# Patient Record
Sex: Female | Born: 1980 | Race: Black or African American | Hispanic: No | Marital: Single | Smoking: Current every day smoker
Health system: Southern US, Community
[De-identification: ages and names within clinical notes are randomized; demographics above are authoritative.]

## PROBLEM LIST (undated history)

## (undated) DIAGNOSIS — D649 Anemia, unspecified: Secondary | ICD-10-CM

## (undated) HISTORY — PX: DILATION AND CURETTAGE OF UTERUS: SHX78

## (undated) HISTORY — PX: WRIST GANGLION EXCISION: SUR520

---

## 2011-01-08 ENCOUNTER — Encounter: Payer: Self-pay | Admitting: Emergency Medicine

## 2011-01-08 ENCOUNTER — Emergency Department (HOSPITAL_COMMUNITY)
Admission: EM | Admit: 2011-01-08 | Discharge: 2011-01-08 | Disposition: A | Discharge status: Home / Self Care | Payer: Medicaid Other | Attending: Emergency Medicine | Admitting: Emergency Medicine

## 2011-01-08 DIAGNOSIS — R109 Unspecified abdominal pain: Secondary | ICD-10-CM | POA: Insufficient documentation

## 2011-01-08 DIAGNOSIS — M545 Low back pain, unspecified: Secondary | ICD-10-CM | POA: Insufficient documentation

## 2011-01-08 DIAGNOSIS — Z3201 Encounter for pregnancy test, result positive: Secondary | ICD-10-CM | POA: Insufficient documentation

## 2011-01-08 DIAGNOSIS — F17200 Nicotine dependence, unspecified, uncomplicated: Secondary | ICD-10-CM | POA: Insufficient documentation

## 2011-01-08 LAB — WET PREP, GENITAL
Clue Cells Wet Prep HPF POC: NONE SEEN
Trich, Wet Prep: NONE SEEN

## 2011-01-08 LAB — CBC
HCT: 34.3 % — ABNORMAL LOW (ref 36.0–46.0)
Hemoglobin: 11.3 g/dL — ABNORMAL LOW (ref 12.0–15.0)
RBC: 3.93 MIL/uL (ref 3.87–5.11)
WBC: 10.2 10*3/uL (ref 4.0–10.5)

## 2011-01-08 LAB — URINALYSIS, ROUTINE W REFLEX MICROSCOPIC
Glucose, UA: NEGATIVE mg/dL
Hgb urine dipstick: NEGATIVE
Specific Gravity, Urine: 1.044 — ABNORMAL HIGH (ref 1.005–1.030)
pH: 6 (ref 5.0–8.0)

## 2011-01-08 LAB — POCT PREGNANCY, URINE: Preg Test, Ur: POSITIVE

## 2011-01-08 LAB — URINE MICROSCOPIC-ADD ON

## 2011-01-08 MED ORDER — ACETAMINOPHEN 325 MG PO TABS
650.0000 mg | ORAL_TABLET | Freq: Once | ORAL | Status: AC
  Administered 2011-01-08: 650 mg via ORAL
  Filled 2011-01-08: qty 2

## 2011-01-08 NOTE — ED Notes (Signed)
C/o lower back pain and abd pain since last night.  Reports positive home pregnancy test on Tuesday.  States she has been taking test and they have been negative.  States she has been bleeding some but not usual amount for the past several months and she noticed that abd was getting bigger 2 months ago.  States it feels like she is having contractions since last night.

## 2011-01-08 NOTE — ED Provider Notes (Signed)
Medical screening examination/treatment/procedure(s) were conducted as a shared visit with non-physician practitioner(s) and myself.  I personally evaluated the patient during the encounter.  Last menstrual period March 2012.  Abdomen is gravid. Ultrasound ordered to assess pregnancy.  Donnetta Hutching, MD 01/08/11 [phone]

## 2011-01-08 NOTE — ED Notes (Signed)
Patient going home.

## 2011-01-08 NOTE — ED Notes (Signed)
States last full period was in March 2012.  States since then she only bleeds for 1 or 2 days.

## 2011-01-08 NOTE — ED Provider Notes (Signed)
History    this is a G6 P3 patient who presents to the ED with a chief complaint of low back pain and abdominal pain since last night. Patient states for the past week she's felt as if something is moving in her abdomen and for the past 2 days she noticed contractions. Patient states she recalled that her last normal menstrual period was sometime in March. However, she has been having intermittent episodes of what appears to be menstruation each month, except that it only lasting for a few days. She does not think that she was pregnant because she has tried checking pregnancy test several times in the past with negative results. A week ago, she has a positive pregnancy test.  CSN: [id number] Arrival date & time: 01/08/2011  7:06 PM   First MD Initiated Contact with Patient 01/08/11 1945      Chief Complaint  Patient presents with  . Abdominal Cramping    (Consider location/radiation/quality/duration/timing/severity/associated sxs/prior treatment) HPI  History reviewed. No pertinent past medical history.  History reviewed. No pertinent past surgical history.  No family history on file.  History  Substance Use Topics  . Smoking status: Current Everyday Smoker  . Smokeless tobacco: Not on file  . Alcohol Use: No    OB History    Grav Para Term Preterm Abortions TAB SAB Ect Mult Living   5 1 1       3       Review of Systems  All other systems reviewed and are negative.    Allergies  Hydrocodone  Home Medications  No current outpatient prescriptions on file.  BP 135/78  Pulse 79  Temp(Src) 98.2 F (36.8 C) (Oral)  Resp 22  SpO2 96%  LMP 04/24/2010  Physical Exam  Nursing note and vitals reviewed. Constitutional: She appears well-developed and well-nourished. No distress.  HENT:  Head: Normocephalic and atraumatic.  Eyes: Conjunctivae are normal.  Neck: Normal range of motion. Neck supple.  Cardiovascular: Normal rate and regular rhythm.   Pulmonary/Chest:  Effort normal and breath sounds normal. She exhibits no tenderness.  Abdominal: Soft. There is tenderness in the suprapubic area. There is no rebound, no tenderness at McBurney's point and negative Murphy's sign. Hernia confirmed negative in the right inguinal area and confirmed negative in the left inguinal area.    Genitourinary: Vagina normal. Pelvic exam was performed with patient supine. There is no rash or lesion on the right labia. There is no rash or lesion on the left labia. Uterus is enlarged. Uterus is not tender. Cervix exhibits discharge. Cervix exhibits no motion tenderness. Right adnexum displays no mass and no tenderness. Left adnexum displays no mass and no tenderness. No erythema, tenderness or bleeding around the vagina. No vaginal discharge found.       No evidence of uterus dilation, effacement, or bloody discharge.  Cervical os close.    Lymphadenopathy:       Right: No inguinal adenopathy present.       Left: No inguinal adenopathy present.    ED Course  Procedures (including critical care time)   Labs Reviewed  POCT PREGNANCY, URINE  URINALYSIS, ROUTINE W REFLEX MICROSCOPIC   No results found.   No diagnosis found.    MDM  Patient is pregnant, and appears to be greater than [redacted] weeks pregnant a pelvic examination is unremarkable. Cervical os close, no evidence of effacement or dilation.   no evidence concerning for pelvic inflammatory disease.   Abdominal ultrasound ordered for further evaluation  for patient's pregnancy.  However, patient refused ultrasound stating that she has to go home to take care of her kids. I explained risks and benefits of ultrasound, and also discussed risk of ectopic pregnancy. Patient acknowledge the risk, and [facility]. She denies significant abdominal pain at this time.     Fayrene Helper, [place] 01/08/11 2237

## 2011-01-08 NOTE — ED Notes (Signed)
Patient states she has been having lower abd and back pain for the last several days.  + mucous discharge.  Took 6 home preg test and the last one came up positive.  Abd enlarged.  Patient tearful and also c/o pain to the right ear and side of face.   3 children at the bedside  Stated the youngest was premature at 36 weeks and the next one was premature at 37 weeks.

## 2011-01-09 LAB — GC/CHLAMYDIA PROBE AMP, GENITAL
Chlamydia, DNA Probe: NEGATIVE
GC Probe Amp, Genital: NEGATIVE

## 2011-01-16 ENCOUNTER — Inpatient Hospital Stay (HOSPITAL_COMMUNITY)
Admission: AD | Admit: 2011-01-16 | Discharge: 2011-01-16 | Disposition: A | Discharge status: Home / Self Care | Payer: Medicaid Other | Source: Ambulatory Visit | Attending: Obstetrics & Gynecology | Admitting: Obstetrics & Gynecology

## 2011-01-16 ENCOUNTER — Inpatient Hospital Stay (HOSPITAL_COMMUNITY): Payer: Medicaid Other

## 2011-01-16 ENCOUNTER — Encounter (HOSPITAL_COMMUNITY): Payer: Self-pay | Admitting: *Deleted

## 2011-01-16 DIAGNOSIS — Z3480 Encounter for supervision of other normal pregnancy, unspecified trimester: Secondary | ICD-10-CM

## 2011-01-16 DIAGNOSIS — O479 False labor, unspecified: Secondary | ICD-10-CM | POA: Insufficient documentation

## 2011-01-16 LAB — DIFFERENTIAL
Basophils Absolute: 0 10*3/uL (ref 0.0–0.1)
Basophils Relative: 0 % (ref 0–1)
Eosinophils Absolute: 0.1 10*3/uL (ref 0.0–0.7)
Eosinophils Relative: 1 % (ref 0–5)
Lymphocytes Relative: 19 % (ref 12–46)
Lymphs Abs: 1.8 10*3/uL (ref 0.7–4.0)
Monocytes Absolute: 0.8 10*3/uL (ref 0.1–1.0)
Monocytes Relative: 8 % (ref 3–12)
Neutro Abs: 6.8 10*3/uL (ref 1.7–7.7)
Neutrophils Relative %: 72 % (ref 43–77)

## 2011-01-16 LAB — CBC
HCT: 33.9 % — ABNORMAL LOW (ref 36.0–46.0)
Hemoglobin: 10.9 g/dL — ABNORMAL LOW (ref 12.0–15.0)
MCH: 29 pg (ref 26.0–34.0)
MCHC: 32.2 g/dL (ref 30.0–36.0)
MCV: 90.2 fL (ref 78.0–100.0)
Platelets: 191 10*3/uL (ref 150–400)
RBC: 3.76 MIL/uL — ABNORMAL LOW (ref 3.87–5.11)
RDW: 16.2 % — ABNORMAL HIGH (ref 11.5–15.5)
WBC: 9.5 10*3/uL (ref 4.0–10.5)

## 2011-01-16 LAB — HIV ANTIBODY (ROUTINE TESTING W REFLEX): HIV: NONREACTIVE

## 2011-01-16 LAB — RAPID HIV SCREEN (WH-MAU): Rapid HIV Screen: NONREACTIVE

## 2011-01-16 LAB — HEPATITIS B SURFACE ANTIGEN: Hepatitis B Surface Ag: NEGATIVE

## 2011-01-16 LAB — RPR: RPR Ser Ql: NONREACTIVE

## 2011-01-16 MED ORDER — HYDROXYZINE HCL 25 MG PO TABS: 25.0000 mg | ORAL_TABLET | Freq: Four times a day (QID) | ORAL | Status: DC

## 2011-01-16 NOTE — Progress Notes (Signed)
Pt states, " About a week ago, I went to [facility] ED, and they had a positive pregnancy test. I haven't missed any periods. Tonight I started having contractions at 8pm. I looked on web MD, and I think I'm full term according to that calculation. If I am laying down and try to turn over I have pressure and I have a sharp pain in my low abdomen."

## 2011-01-16 NOTE — Progress Notes (Signed)
"  I just found out I was pregnant last week.  I went to [facility] for a toothache and headache 01/08/11.  I just had a (+) UPT that Tuesday before.  I had some idea that I was pregnant, but I always had (-) UPTs and never missed my period, so I didn't think anything about it.  My last 4-5 day period was in March, then they changed to 1-2 days, but never stopped.  That's how I knew I was pregnant in the past was a missed period.  For the 2-3 days has been decreased FM as compared to the last 2 weeks."

## 2011-01-16 NOTE — ED Provider Notes (Cosign Needed)
Stacey Blake is a 30 y.o. female presenting with unknown gestation with concern for labor.  Stacey Blake states that she did not know she was pregnant until last week when she presented to the ER for tooth pain.  There a pregnancy test was performed and found to be positive.  Prior to this she states that she was getting regular periods and had taken a few pregnancy tests which were negative.  She starting having contractions last evening around 8pm which have increased in intensity and are now occuring every 2 minutes.  She also has concern that she has broken her water.  She has not felt a large gush of fluid but does state that every time she uses the bathroom she notes continued leakage of fluid.  History OB History    Grav Para Term Preterm Abortions TAB SAB Ect Mult Living   6 4 1 3 1  1  1 3      History reviewed. No pertinent past medical history. Past Surgical History  Procedure Date  . Wrist ganglion excision 2007 x2   Family History: family history is not on file. Social History:  reports that she has been smoking Cigarettes.  She has a 3 pack-year smoking history. She does not have any smokeless tobacco history on file. She reports that she drinks alcohol. She reports that she uses illicit drugs (Marijuana and Cocaine).  ROS As per HPI  Dilation: 2 Effacement (%): 80 Station: -3 Exam by:: Dr. Remigio Blake & Stacey Miss, RN Blood pressure 127/66, pulse 70, temperature 98.6 F (37 C), temperature source Oral, resp. rate 20, height 5' 9.25" (1.759 m), weight 81.421 kg (179 lb 8 oz), last menstrual period 04/24/2010, SpO2 98.00%, unknown if currently breastfeeding. Exam Physical Exam  Constitutional: She is oriented to person, place, and time. She appears well-developed and well-nourished.  HENT:  Head: Normocephalic and atraumatic.  Cardiovascular: Normal rate, regular rhythm and normal heart sounds.   Respiratory: Effort normal and breath sounds normal. She has no wheezes. She has no  rales.  GI:       Normal gravid fundus  Genitourinary: Vagina normal.  Neurological: She is alert and oriented to person, place, and time.  Skin: Skin is warm and dry.  Psychiatric: She has a normal mood and affect. Her behavior is normal. Judgment and thought content normal.    Prenatal labs: ABO, Rh: --/--/O POS (11/16 2025) Antibody:   Rubella:   RPR:    HBsAg:    HIV:    GBS:    Dilation: 2 Effacement (%): 80 Cervical Position: Posterior Station: -3 Presentation: Vertex Exam by:: dr Stacey Blake  Assessment/Plan: A: 30 y.o. presenting at unknown gestation for rule out labor  P: 1. IUP: Prenatal labs and an ultrasound was performed.  Ultrasound shows a female fetus between 35-38weeks.  Patient's fundus was measured to be 36cm so size appears to be consistent with dates.  Patient was discharged in stable condition and advised to seek prenatal care.  Labor precautions given.  2. Rule out labor: Patient was observed for 2 hours and did not demonstrate any cervical change.  A prescription for vistaril was given and patient was discharged to home.  Stacey Blake 01/16/2011, 2:44 AM

## 2011-01-22 ENCOUNTER — Encounter (HOSPITAL_COMMUNITY): Payer: Self-pay

## 2011-01-22 ENCOUNTER — Inpatient Hospital Stay (HOSPITAL_COMMUNITY)
Admission: AD | Admit: 2011-01-22 | Discharge: 2011-01-22 | Disposition: A | Discharge status: Home / Self Care | Payer: Self-pay | Source: Ambulatory Visit | Attending: Family Medicine | Admitting: Family Medicine

## 2011-01-22 DIAGNOSIS — K047 Periapical abscess without sinus: Secondary | ICD-10-CM | POA: Insufficient documentation

## 2011-01-22 DIAGNOSIS — O99891 Other specified diseases and conditions complicating pregnancy: Secondary | ICD-10-CM | POA: Insufficient documentation

## 2011-01-22 LAB — URINALYSIS, ROUTINE W REFLEX MICROSCOPIC
Hgb urine dipstick: NEGATIVE
Ketones, ur: NEGATIVE mg/dL
Nitrite: NEGATIVE
Protein, ur: 30 mg/dL — AB
Specific Gravity, Urine: 1.03 (ref 1.005–1.030)
Urobilinogen, UA: 0.2 mg/dL (ref 0.0–1.0)

## 2011-01-22 LAB — URINE MICROSCOPIC-ADD ON

## 2011-01-22 MED ORDER — CEPHALEXIN 500 MG PO CAPS: 500.0000 mg | ORAL_CAPSULE | Freq: Four times a day (QID) | ORAL | Status: AC

## 2011-01-22 MED ORDER — ACETAMINOPHEN-CODEINE #3 300-30 MG PO TABS: 1.0000 | ORAL_TABLET | ORAL | Status: AC | PRN

## 2011-01-22 MED ORDER — CEPHALEXIN 500 MG PO CAPS
500.0000 mg | ORAL_CAPSULE | Freq: Once | ORAL | Status: AC
  Administered 2011-01-22: 500 mg via ORAL
  Filled 2011-01-22: qty 1

## 2011-01-22 MED ORDER — ACETAMINOPHEN-CODEINE #3 300-30 MG PO TABS
1.0000 | ORAL_TABLET | Freq: Once | ORAL | Status: AC
  Administered 2011-01-22: 1 via ORAL
  Filled 2011-01-22: qty 1

## 2011-01-22 NOTE — Progress Notes (Signed)
Pt states r ear pain that began early this afternoon, r ear radiating pain down into her jaw. Took 2 extra strength tylenol at 1300 w/no relief. Clear vaginal d/c noted-not watery, denies bleeding, +FM.

## 2011-01-22 NOTE — ED Provider Notes (Signed)
I have seen/examined this pt and agree with the student's assessment and plan.   Taequan Stockhausen E.

## 2011-01-22 NOTE — ED Provider Notes (Signed)
History    Stacey Blake is a 30 YO [id number] at [redacted]w[redacted]d presents today with severe jaw/tooth pain.  Chief Complaint  Patient presents with   Otalgia   HPI Patient reports she began feeling toothache at 11:30 a.m. At the molar right side this morning and took Tylenol to help relieve the pain. This was not helpful and pain became worse and spread to just below her ear and jaw. Patient tried cold compress and warm compress without changes to symptoms. Patient has had toothache before and a tooth removed in the area of pain. Patient reports pain is worse with jaw movement, chewing, talking. Patient denies fevers, chills, recent illness, or sick contacts.  OB History    Grav Para Term Preterm Abortions TAB SAB Ect Mult Living   6 4 1 3 1  1  1 3       Past Medical History  Diagnosis Date   Preterm labor     Past Surgical History  Procedure Date   Wrist ganglion excision 2007 x2   Dilation and curettage of uterus     History reviewed. No pertinent family history.  History  Substance Use Topics   Smoking status: Current Everyday Smoker -- 0.2 packs/day for 12 years    Types: Cigarettes   Smokeless tobacco: Never Used   Alcohol Use: No     Occassional (last drink: 2.5 months ago-Tequila)    Allergies:  Allergies  Allergen Reactions   Hydrocodone Rash    Prescriptions prior to admission  Medication Sig Dispense Refill   acetaminophen (TYLENOL) 500 MG tablet Take 1,000 mg by mouth every 6 (six) hours as needed. Tooth ache         Review of Systems  Constitutional: Negative for fever and chills.  HENT: Positive for ear pain. Negative for hearing loss, tinnitus and ear discharge.        Right ear pain. Right sided face pain and right molar toothache.  Genitourinary: Negative.   Musculoskeletal: Negative for myalgias.  Neurological: Negative for dizziness and headaches.   Physical Exam   Blood pressure 102/70, pulse 69, temperature 98 F (36.7 C), temperature source  Oral, resp. rate 16, height 5' 10.5" (1.791 m), last menstrual period 04/24/2010, unknown if currently breastfeeding.  Physical Exam  Constitutional: She is oriented to person, place, and time. She appears well-developed and well-nourished.  HENT:  Head: Normocephalic and atraumatic.  Right Ear: External ear normal.  Left Ear: External ear normal.  Nose: Nose normal.  Mouth/Throat: Oropharynx is clear and moist.       Right ear extremely tender to touch. Right tympanic membrane diminished light reflection, possible fluid behind membrane. Erythematous right ear canal.  Right jawline extremely tender to light touch. Not warm or erythematous.   Oral mucosa moist. Teeth missing, poor dentition. Right third molar present, appears unhealthy. Right second molar removed. Premolars not present. Gums pink, moist, without lesions. No exudate observed.   Eyes: Conjunctivae are normal. Pupils are equal, round, and reactive to light.  Neck: Normal range of motion. Neck supple.  Cardiovascular: Normal rate, regular rhythm and normal heart sounds.   Respiratory: Effort normal and breath sounds normal.  Neurological: She is alert and oriented to person, place, and time.  Skin: Skin is warm.    MAU Course  Procedures Physical exam  Assessment and Plan  1. Dental abscess: Discussed with patient probably cause of pain is due to dental abscess. Patient will receive antibiotics, cephALEXin (KEFLEX) capsule 500 mg and narcotic  pain medication to treat. Encouraged patient to seek dental care soon for follow up. Also encouraged patient to follow through with outpatient prenatal care here at [facility]. Patient will be discharged home.   Mosetta Putt, PA-S 01/22/2011, 4:52 PM

## 2011-01-23 ENCOUNTER — Inpatient Hospital Stay (HOSPITAL_COMMUNITY)
Admission: AD | Admit: 2011-01-23 | Discharge: 2011-01-26 | DRG: 766 | Disposition: A | Discharge status: Home / Self Care | Payer: Medicaid Other | Source: Ambulatory Visit | Attending: Family Medicine | Admitting: Family Medicine

## 2011-01-23 ENCOUNTER — Encounter (HOSPITAL_COMMUNITY): Payer: Self-pay

## 2011-01-23 ENCOUNTER — Encounter (HOSPITAL_COMMUNITY): Payer: Self-pay | Admitting: Anesthesiology

## 2011-01-23 ENCOUNTER — Encounter (HOSPITAL_COMMUNITY)
Admission: AD | Disposition: A | Discharge status: Home / Self Care | Payer: Self-pay | Source: Ambulatory Visit | Attending: Family Medicine

## 2011-01-23 ENCOUNTER — Inpatient Hospital Stay (HOSPITAL_COMMUNITY): Payer: Medicaid Other | Admitting: Anesthesiology

## 2011-01-23 DIAGNOSIS — O4290 Premature rupture of membranes, unspecified as to length of time between rupture and onset of labor, unspecified weeks of gestation: Secondary | ICD-10-CM | POA: Diagnosis present

## 2011-01-23 DIAGNOSIS — K0889 Other specified disorders of teeth and supporting structures: Secondary | ICD-10-CM | POA: Diagnosis present

## 2011-01-23 DIAGNOSIS — D649 Anemia, unspecified: Secondary | ICD-10-CM | POA: Diagnosis not present

## 2011-01-23 DIAGNOSIS — O9903 Anemia complicating the puerperium: Secondary | ICD-10-CM

## 2011-01-23 DIAGNOSIS — O0930 Supervision of pregnancy with insufficient antenatal care, unspecified trimester: Secondary | ICD-10-CM

## 2011-01-23 DIAGNOSIS — Z3009 Encounter for other general counseling and advice on contraception: Secondary | ICD-10-CM | POA: Diagnosis present

## 2011-01-23 DIAGNOSIS — O0943 Supervision of pregnancy with grand multiparity, third trimester: Secondary | ICD-10-CM

## 2011-01-23 DIAGNOSIS — F17200 Nicotine dependence, unspecified, uncomplicated: Secondary | ICD-10-CM | POA: Diagnosis present

## 2011-01-23 LAB — CBC
HCT: 34.9 % — ABNORMAL LOW (ref 36.0–46.0)
Hemoglobin: 11.2 g/dL — ABNORMAL LOW (ref 12.0–15.0)
MCHC: 32.1 g/dL (ref 30.0–36.0)
MCV: 89.5 fL (ref 78.0–100.0)
RDW: 15.8 % — ABNORMAL HIGH (ref 11.5–15.5)

## 2011-01-23 LAB — RAPID URINE DRUG SCREEN, HOSP PERFORMED
Amphetamines: NOT DETECTED
Benzodiazepines: NOT DETECTED
Opiates: POSITIVE — AB

## 2011-01-23 LAB — AMNISURE RUPTURE OF MEMBRANE (ROM) NOT AT ARMC: Amnisure ROM: POSITIVE

## 2011-01-23 SURGERY — Surgical Case
Anesthesia: Spinal | Site: Abdomen | Wound class: Clean Contaminated

## 2011-01-23 MED ORDER — FENTANYL CITRATE 0.05 MG/ML IJ SOLN
INTRAMUSCULAR | Status: AC
  Filled 2011-01-23: qty 2

## 2011-01-23 MED ORDER — CEFAZOLIN SODIUM 1-5 GM-% IV SOLN
INTRAVENOUS | Status: DC | PRN
  Administered 2011-01-23: 1 g via INTRAVENOUS

## 2011-01-23 MED ORDER — LACTATED RINGERS IV SOLN
INTRAVENOUS | Status: DC
  Administered 2011-01-24 (×2): via INTRAVENOUS

## 2011-01-23 MED ORDER — FENTANYL CITRATE 0.05 MG/ML IJ SOLN
INTRAMUSCULAR | Status: DC | PRN
  Administered 2011-01-23: 15 ug via INTRATHECAL

## 2011-01-23 MED ORDER — PHENYLEPHRINE HCL 10 MG/ML IJ SOLN
INTRAMUSCULAR | Status: DC | PRN
  Administered 2011-01-23: 80 ug via INTRAVENOUS
  Administered 2011-01-23: 120 ug via INTRAVENOUS
  Administered 2011-01-23: 80 ug via INTRAVENOUS
  Administered 2011-01-23: 120 ug via INTRAVENOUS

## 2011-01-23 MED ORDER — SODIUM CHLORIDE 0.9 % IR SOLN
Status: DC | PRN
  Administered 2011-01-23: 1000 mL

## 2011-01-23 MED ORDER — KETOROLAC TROMETHAMINE 60 MG/2ML IM SOLN
60.0000 mg | Freq: Once | INTRAMUSCULAR | Status: AC | PRN
  Administered 2011-01-23: 60 mg via INTRAMUSCULAR

## 2011-01-23 MED ORDER — FENTANYL 2.5 MCG/ML BUPIVACAINE 1/10 % EPIDURAL INFUSION (WH - ANES)
14.0000 mL/h | INTRAMUSCULAR | Status: DC
  Filled 2011-01-23: qty 60

## 2011-01-23 MED ORDER — IBUPROFEN 600 MG PO TABS
600.0000 mg | ORAL_TABLET | Freq: Four times a day (QID) | ORAL | Status: DC | PRN
  Filled 2011-01-23 (×3): qty 1

## 2011-01-23 MED ORDER — LANOLIN HYDROUS EX OINT: 1.0000 "application " | TOPICAL_OINTMENT | CUTANEOUS | Status: DC | PRN

## 2011-01-23 MED ORDER — BUPIVACAINE IN DEXTROSE 0.75-8.25 % IT SOLN
INTRATHECAL | Status: DC | PRN
  Administered 2011-01-23: 1.5 mL via INTRATHECAL

## 2011-01-23 MED ORDER — PENICILLIN G POTASSIUM 5000000 UNITS IJ SOLR
2.5000 10*6.[IU] | INTRAVENOUS | Status: DC
  Administered 2011-01-23: 2.5 10*6.[IU] via INTRAVENOUS
  Filled 2011-01-23 (×7): qty 2.5

## 2011-01-23 MED ORDER — PHENYLEPHRINE 40 MCG/ML (10ML) SYRINGE FOR IV PUSH (FOR BLOOD PRESSURE SUPPORT)
80.0000 ug | PREFILLED_SYRINGE | INTRAVENOUS | Status: DC | PRN
  Filled 2011-01-23: qty 5

## 2011-01-23 MED ORDER — PRENATAL PLUS 27-1 MG PO TABS
1.0000 | ORAL_TABLET | Freq: Every day | ORAL | Status: DC
  Administered 2011-01-24 – 2011-01-26 (×2): 1 via ORAL
  Filled 2011-01-23 (×2): qty 1

## 2011-01-23 MED ORDER — SCOPOLAMINE 1 MG/3DAYS TD PT72
1.0000 | MEDICATED_PATCH | Freq: Once | TRANSDERMAL | Status: DC
  Filled 2011-01-23: qty 1

## 2011-01-23 MED ORDER — NALBUPHINE HCL 10 MG/ML IJ SOLN
5.0000 mg | INTRAMUSCULAR | Status: DC | PRN
  Administered 2011-01-23: 5 mg via INTRAVENOUS
  Administered 2011-01-24: 10 mg via INTRAVENOUS
  Filled 2011-01-23 (×2): qty 1

## 2011-01-23 MED ORDER — FLEET ENEMA 7-19 GM/118ML RE ENEM: 1.0000 | ENEMA | RECTAL | Status: DC | PRN

## 2011-01-23 MED ORDER — ONDANSETRON HCL 4 MG/2ML IJ SOLN: 4.0000 mg | Freq: Three times a day (TID) | INTRAMUSCULAR | Status: DC | PRN

## 2011-01-23 MED ORDER — DIPHENHYDRAMINE HCL 25 MG PO CAPS: 25.0000 mg | ORAL_CAPSULE | Freq: Four times a day (QID) | ORAL | Status: DC | PRN

## 2011-01-23 MED ORDER — MIDAZOLAM HCL 2 MG/2ML IJ SOLN
INTRAMUSCULAR | Status: AC
  Filled 2011-01-23: qty 2

## 2011-01-23 MED ORDER — OXYTOCIN 10 UNIT/ML IJ SOLN
20.0000 [IU] | INTRAVENOUS | Status: DC | PRN
  Administered 2011-01-23: 20 [IU] via INTRAVENOUS

## 2011-01-23 MED ORDER — TETANUS-DIPHTH-ACELL PERTUSSIS 5-2.5-18.5 LF-MCG/0.5 IM SUSP
0.5000 mL | Freq: Once | INTRAMUSCULAR | Status: AC
  Administered 2011-01-26: 0.5 mL via INTRAMUSCULAR
  Filled 2011-01-23: qty 0.5

## 2011-01-23 MED ORDER — NALOXONE HCL 0.4 MG/ML IJ SOLN: 0.4000 mg | INTRAMUSCULAR | Status: DC | PRN

## 2011-01-23 MED ORDER — LACTATED RINGERS IV SOLN
INTRAVENOUS | Status: DC | PRN
  Administered 2011-01-23 (×2): via INTRAVENOUS

## 2011-01-23 MED ORDER — OXYCODONE-ACETAMINOPHEN 5-325 MG PO TABS
1.0000 | ORAL_TABLET | ORAL | Status: DC | PRN
  Administered 2011-01-24 – 2011-01-25 (×4): 1 via ORAL
  Filled 2011-01-23 (×4): qty 1

## 2011-01-23 MED ORDER — DIPHENHYDRAMINE HCL 50 MG/ML IJ SOLN: 25.0000 mg | INTRAMUSCULAR | Status: DC | PRN

## 2011-01-23 MED ORDER — MORPHINE SULFATE 0.5 MG/ML IJ SOLN
INTRAMUSCULAR | Status: AC
  Filled 2011-01-23: qty 10

## 2011-01-23 MED ORDER — PENICILLIN G POTASSIUM 5000000 UNITS IJ SOLR
5.0000 10*6.[IU] | Freq: Once | INTRAVENOUS | Status: AC
  Administered 2011-01-23: 5 10*6.[IU] via INTRAVENOUS
  Filled 2011-01-23: qty 5

## 2011-01-23 MED ORDER — KETOROLAC TROMETHAMINE 60 MG/2ML IM SOLN
INTRAMUSCULAR | Status: AC
  Administered 2011-01-23: 60 mg via INTRAMUSCULAR
  Filled 2011-01-23: qty 2

## 2011-01-23 MED ORDER — DIPHENHYDRAMINE HCL 50 MG/ML IJ SOLN
12.5000 mg | INTRAMUSCULAR | Status: DC | PRN
  Administered 2011-01-24: 12.5 mg via INTRAVENOUS
  Filled 2011-01-23: qty 1

## 2011-01-23 MED ORDER — OXYTOCIN 20 UNITS IN LACTATED RINGERS INFUSION - SIMPLE
125.0000 mL/h | INTRAVENOUS | Status: AC
  Administered 2011-01-23: 125 mL/h via INTRAVENOUS

## 2011-01-23 MED ORDER — SODIUM CHLORIDE 0.9 % IJ SOLN: 3.0000 mL | INTRAMUSCULAR | Status: DC | PRN

## 2011-01-23 MED ORDER — LACTATED RINGERS IV SOLN: 500.0000 mL | Freq: Once | INTRAVENOUS | Status: DC

## 2011-01-23 MED ORDER — MIDAZOLAM HCL 5 MG/5ML IJ SOLN
INTRAMUSCULAR | Status: DC | PRN
  Administered 2011-01-23: 1 mg via INTRAVENOUS

## 2011-01-23 MED ORDER — KETOROLAC TROMETHAMINE 30 MG/ML IJ SOLN
30.0000 mg | Freq: Four times a day (QID) | INTRAMUSCULAR | Status: AC | PRN
  Administered 2011-01-24: 30 mg via INTRAVENOUS
  Filled 2011-01-23: qty 1

## 2011-01-23 MED ORDER — PHENYLEPHRINE 40 MCG/ML (10ML) SYRINGE FOR IV PUSH (FOR BLOOD PRESSURE SUPPORT): 80.0000 ug | PREFILLED_SYRINGE | INTRAVENOUS | Status: DC | PRN

## 2011-01-23 MED ORDER — SIMETHICONE 80 MG PO CHEW
80.0000 mg | CHEWABLE_TABLET | Freq: Three times a day (TID) | ORAL | Status: DC
  Administered 2011-01-24 – 2011-01-26 (×5): 80 mg via ORAL

## 2011-01-23 MED ORDER — INFLUENZA VIRUS VACC SPLIT PF IM SUSP
0.5000 mL | INTRAMUSCULAR | Status: AC
  Filled 2011-01-23: qty 0.5

## 2011-01-23 MED ORDER — NALBUPHINE SYRINGE 5 MG/0.5 ML
10.0000 mg | INJECTION | INTRAMUSCULAR | Status: DC | PRN
  Administered 2011-01-23: 10 mg via INTRAVENOUS
  Filled 2011-01-23 (×2): qty 1

## 2011-01-23 MED ORDER — METOCLOPRAMIDE HCL 5 MG/ML IJ SOLN: 10.0000 mg | Freq: Three times a day (TID) | INTRAMUSCULAR | Status: DC | PRN

## 2011-01-23 MED ORDER — IBUPROFEN 600 MG PO TABS
600.0000 mg | ORAL_TABLET | Freq: Four times a day (QID) | ORAL | Status: DC
  Administered 2011-01-24 – 2011-01-26 (×7): 600 mg via ORAL
  Filled 2011-01-23 (×4): qty 1

## 2011-01-23 MED ORDER — ONDANSETRON HCL 4 MG/2ML IJ SOLN: 4.0000 mg | INTRAMUSCULAR | Status: DC | PRN

## 2011-01-23 MED ORDER — KETOROLAC TROMETHAMINE 30 MG/ML IJ SOLN: 30.0000 mg | Freq: Four times a day (QID) | INTRAMUSCULAR | Status: AC | PRN

## 2011-01-23 MED ORDER — SENNOSIDES-DOCUSATE SODIUM 8.6-50 MG PO TABS
2.0000 | ORAL_TABLET | Freq: Every day | ORAL | Status: DC
  Administered 2011-01-24 – 2011-01-25 (×2): 2 via ORAL

## 2011-01-23 MED ORDER — EPHEDRINE 5 MG/ML INJ: 10.0000 mg | INTRAVENOUS | Status: DC | PRN

## 2011-01-23 MED ORDER — OXYCODONE-ACETAMINOPHEN 5-325 MG PO TABS: 2.0000 | ORAL_TABLET | ORAL | Status: DC | PRN

## 2011-01-23 MED ORDER — OXYTOCIN 20 UNITS IN LACTATED RINGERS INFUSION - SIMPLE
1.0000 m[IU]/min | INTRAVENOUS | Status: DC
  Administered 2011-01-23: 10 m[IU]/min via INTRAVENOUS
  Administered 2011-01-23: 9 m[IU]/min via INTRAVENOUS
  Administered 2011-01-23: 1 m[IU]/min via INTRAVENOUS
  Administered 2011-01-23: 6 m[IU]/min via INTRAVENOUS
  Filled 2011-01-23: qty 1000

## 2011-01-23 MED ORDER — DIPHENHYDRAMINE HCL 50 MG/ML IJ SOLN
INTRAMUSCULAR | Status: AC
  Filled 2011-01-23: qty 1

## 2011-01-23 MED ORDER — SIMETHICONE 80 MG PO CHEW
80.0000 mg | CHEWABLE_TABLET | ORAL | Status: DC | PRN
  Administered 2011-01-24: 80 mg via ORAL

## 2011-01-23 MED ORDER — SODIUM CHLORIDE 0.9 % IV SOLN
1.0000 ug/kg/h | INTRAVENOUS | Status: DC | PRN
  Filled 2011-01-23: qty 2.5

## 2011-01-23 MED ORDER — EPHEDRINE 5 MG/ML INJ
10.0000 mg | INTRAVENOUS | Status: DC | PRN
  Filled 2011-01-23: qty 4

## 2011-01-23 MED ORDER — DIPHENHYDRAMINE HCL 50 MG/ML IJ SOLN: 12.5000 mg | INTRAMUSCULAR | Status: DC | PRN

## 2011-01-23 MED ORDER — PHENYLEPHRINE 40 MCG/ML (10ML) SYRINGE FOR IV PUSH (FOR BLOOD PRESSURE SUPPORT)
PREFILLED_SYRINGE | INTRAVENOUS | Status: AC
  Filled 2011-01-23: qty 10

## 2011-01-23 MED ORDER — LACTATED RINGERS IV SOLN
INTRAVENOUS | Status: DC
  Administered 2011-01-23 (×2): via INTRAVENOUS

## 2011-01-23 MED ORDER — MEPERIDINE HCL 25 MG/ML IJ SOLN
INTRAMUSCULAR | Status: AC
  Filled 2011-01-23: qty 1

## 2011-01-23 MED ORDER — TERBUTALINE SULFATE 1 MG/ML IJ SOLN
0.2500 mg | Freq: Once | INTRAMUSCULAR | Status: AC | PRN
  Administered 2011-01-23: 0.25 mg via SUBCUTANEOUS
  Filled 2011-01-23: qty 1

## 2011-01-23 MED ORDER — MENTHOL 3 MG MT LOZG: 1.0000 | LOZENGE | OROMUCOSAL | Status: DC | PRN

## 2011-01-23 MED ORDER — NALBUPHINE HCL 10 MG/ML IJ SOLN: 10.0000 mg | INTRAMUSCULAR | Status: DC | PRN

## 2011-01-23 MED ORDER — NALBUPHINE HCL 10 MG/ML IJ SOLN
5.0000 mg | INTRAMUSCULAR | Status: DC | PRN
  Filled 2011-01-23: qty 1

## 2011-01-23 MED ORDER — ZOLPIDEM TARTRATE 5 MG PO TABS: 5.0000 mg | ORAL_TABLET | Freq: Every evening | ORAL | Status: DC | PRN

## 2011-01-23 MED ORDER — ACETAMINOPHEN 325 MG PO TABS: 650.0000 mg | ORAL_TABLET | ORAL | Status: DC | PRN

## 2011-01-23 MED ORDER — MEPERIDINE HCL 25 MG/ML IJ SOLN
INTRAMUSCULAR | Status: DC | PRN
  Administered 2011-01-23: 25 mg via INTRAVENOUS

## 2011-01-23 MED ORDER — DIBUCAINE 1 % RE OINT: 1.0000 "application " | TOPICAL_OINTMENT | RECTAL | Status: DC | PRN

## 2011-01-23 MED ORDER — ONDANSETRON HCL 4 MG/2ML IJ SOLN: 4.0000 mg | Freq: Four times a day (QID) | INTRAMUSCULAR | Status: DC | PRN

## 2011-01-23 MED ORDER — LIDOCAINE HCL (PF) 1 % IJ SOLN: 30.0000 mL | INTRAMUSCULAR | Status: DC | PRN

## 2011-01-23 MED ORDER — OXYTOCIN 10 UNIT/ML IJ SOLN
INTRAMUSCULAR | Status: AC
  Filled 2011-01-23: qty 4

## 2011-01-23 MED ORDER — IBUPROFEN 600 MG PO TABS: 600.0000 mg | ORAL_TABLET | Freq: Four times a day (QID) | ORAL | Status: DC | PRN

## 2011-01-23 MED ORDER — MORPHINE SULFATE (PF) 0.5 MG/ML IJ SOLN
INTRAMUSCULAR | Status: DC | PRN
  Administered 2011-01-23: .9 mg via INTRAVENOUS
  Administered 2011-01-23: 1 mg via INTRAVENOUS
  Administered 2011-01-23: 2 mg via INTRAVENOUS

## 2011-01-23 MED ORDER — DIPHENHYDRAMINE HCL 50 MG/ML IJ SOLN
INTRAMUSCULAR | Status: DC | PRN
  Administered 2011-01-23 (×2): 25 mg via INTRAVENOUS

## 2011-01-23 MED ORDER — ONDANSETRON HCL 4 MG/2ML IJ SOLN
INTRAMUSCULAR | Status: DC | PRN
  Administered 2011-01-23: 4 mg via INTRAVENOUS

## 2011-01-23 MED ORDER — WITCH HAZEL-GLYCERIN EX PADS: 1.0000 "application " | MEDICATED_PAD | CUTANEOUS | Status: DC | PRN

## 2011-01-23 MED ORDER — CITRIC ACID-SODIUM CITRATE 334-500 MG/5ML PO SOLN
30.0000 mL | ORAL | Status: DC | PRN
  Filled 2011-01-23 (×2): qty 15

## 2011-01-23 MED ORDER — MAGNESIUM HYDROXIDE 400 MG/5ML PO SUSP: 30.0000 mL | ORAL | Status: DC | PRN

## 2011-01-23 MED ORDER — MORPHINE SULFATE (PF) 0.5 MG/ML IJ SOLN
INTRAMUSCULAR | Status: DC | PRN
  Administered 2011-01-23: .1 mg via INTRATHECAL

## 2011-01-23 MED ORDER — FENTANYL CITRATE 0.05 MG/ML IJ SOLN
INTRAMUSCULAR | Status: DC | PRN
  Administered 2011-01-23: 50 ug via INTRAVENOUS
  Administered 2011-01-23: 85 ug via INTRAVENOUS
  Administered 2011-01-23: 50 ug via INTRAVENOUS

## 2011-01-23 MED ORDER — DIPHENHYDRAMINE HCL 25 MG PO CAPS: 25.0000 mg | ORAL_CAPSULE | ORAL | Status: DC | PRN

## 2011-01-23 MED ORDER — LACTATED RINGERS IV SOLN: 500.0000 mL | INTRAVENOUS | Status: DC | PRN

## 2011-01-23 MED ORDER — ONDANSETRON HCL 4 MG PO TABS: 4.0000 mg | ORAL_TABLET | ORAL | Status: DC | PRN

## 2011-01-23 MED ORDER — MEPERIDINE HCL 25 MG/ML IJ SOLN: 6.2500 mg | INTRAMUSCULAR | Status: DC | PRN

## 2011-01-23 MED ORDER — ONDANSETRON HCL 4 MG/2ML IJ SOLN
INTRAMUSCULAR | Status: AC
  Filled 2011-01-23: qty 2

## 2011-01-23 SURGICAL SUPPLY — 30 items
BENZOIN TINCTURE PRP APPL 2/3 (GAUZE/BANDAGES/DRESSINGS) ×2 IMPLANT
CLOTH BEACON ORANGE TIMEOUT ST (SAFETY) ×2 IMPLANT
DRESSING TELFA 8X3 (GAUZE/BANDAGES/DRESSINGS) ×2 IMPLANT
DRSG PAD ABDOMINAL 8X10 ST (GAUZE/BANDAGES/DRESSINGS) ×2 IMPLANT
ELECT REM PT RETURN 9FT ADLT (ELECTROSURGICAL) ×2
ELECTRODE REM PT RTRN 9FT ADLT (ELECTROSURGICAL) ×1 IMPLANT
EXTRACTOR VACUUM KIWI (MISCELLANEOUS) IMPLANT
GLOVE BIO SURGEON ST LM GN SZ9 (GLOVE) ×2 IMPLANT
GLOVE BIOGEL PI IND STRL 9 (GLOVE) ×2 IMPLANT
GLOVE BIOGEL PI INDICATOR 9 (GLOVE) ×2
GOWN PREVENTION PLUS LG XLONG (DISPOSABLE) ×2 IMPLANT
GOWN PREVENTION PLUS XLARGE (GOWN DISPOSABLE) ×2 IMPLANT
GOWN STRL REIN 3XL LVL4 (GOWN DISPOSABLE) ×2 IMPLANT
NEEDLE HYPO 25X5/8 SAFETYGLIDE (NEEDLE) IMPLANT
NS IRRIG 1000ML POUR BTL (IV SOLUTION) ×2 IMPLANT
PACK C SECTION WH (CUSTOM PROCEDURE TRAY) ×2 IMPLANT
RTRCTR C-SECT PINK 25CM LRG (MISCELLANEOUS) IMPLANT
SLEEVE SCD COMPRESS KNEE MED (MISCELLANEOUS) ×2 IMPLANT
STRIP CLOSURE SKIN 1/2X4 (GAUZE/BANDAGES/DRESSINGS) ×2 IMPLANT
SUT CHROMIC 0 CTX 36 (SUTURE) ×10 IMPLANT
SUT CHROMIC 2 0 CT 1 (SUTURE) ×2 IMPLANT
SUT VIC AB 0 CT1 27 (SUTURE) ×2
SUT VIC AB 0 CT1 27XBRD ANBCTR (SUTURE) ×2 IMPLANT
SUT VIC AB 2-0 CT1 27 (SUTURE) ×2
SUT VIC AB 2-0 CT1 TAPERPNT 27 (SUTURE) ×2 IMPLANT
SUT VIC AB 4-0 KS 27 (SUTURE) ×2 IMPLANT
SYR BULB IRRIGATION 50ML (SYRINGE) IMPLANT
TAPE CLOTH SURG 4X10 WHT LF (GAUZE/BANDAGES/DRESSINGS) ×2 IMPLANT
TRAY FOLEY CATH 14FR (SET/KITS/TRAYS/PACK) IMPLANT
WATER STERILE IRR 1000ML POUR (IV SOLUTION) ×2 IMPLANT

## 2011-01-23 NOTE — Progress Notes (Signed)
Matsuko Kretz is a 30 y.o. [id number] at [redacted]w[redacted]d by LMP admitted for rupture of membranes  Subjective: Not feeling contractions yet. C/O tooth pain, has appt Monday for dentist  Objective: BP 114/68  Pulse 65  Temp(Src) 97.9 F (36.6 C) (Oral)  Resp 18  Ht 5' 10.5" (1.791 m)  Wt 79.833 kg (176 lb)  BMI 24.90 kg/m2  LMP 04/24/2010  Breastfeeding? Unknown      FHT:  FHR: 130 bpm, variability: moderate,  accelerations:  Present,  decelerations:  Absent UC:   irregular, every 3-4 minutes SVE:   Dilation: 2 Effacement (%): 50 Station: -3 Exam by:: hk  Labs: Lab Results  Component Value Date   WBC 11.1* 01/23/2011   HGB 11.2* 01/23/2011   HCT 34.9* 01/23/2011   MCV 89.5 01/23/2011   PLT 206 01/23/2011    Assessment / Plan: Augmentation of labor, progressing well  Labor: Augmented labor, will continue to increase Pitocin as needed Preeclampsia:  n/a Fetal Wellbeing:  Category I Pain Control:  Labor support without medications I/D:  n/a Anticipated MOD:  NSVD  Layanna Charo 01/23/2011, 1:46 PM

## 2011-01-23 NOTE — Progress Notes (Signed)
Stacey Blake is a 30 y.o. [id number] at [redacted]w[redacted]d by ultrasound admitted for rupture of membranes  Subjective: Patient doing well.  Complains of toothache that has been a chronic problem.  She left Tylenol #3 at home and is asking for pain medication.  Patient chooses to start Pitocin at this time.  It has been about 8 hours since her membranes ruptured.  Objective: BP 131/72  Pulse 76  Temp(Src) 97.8 F (36.6 C) (Oral)  Resp 20  Ht 5' 10.5" (1.791 m)  Wt 79.833 kg (176 lb)  BMI 24.90 kg/m2  LMP 04/24/2010  Breastfeeding? Unknown     Labs: Lab Results  Component Value Date   WBC 11.1* 01/23/2011   HGB 11.2* 01/23/2011   HCT 34.9* 01/23/2011   MCV 89.5 01/23/2011   PLT 206 01/23/2011    Assessment / Plan: Per patient request, will start labor augmentation with standard Pitocin orders.  Labor: starting Pitocin now, continue to monitor, then recheck cervix in 1-2 hrs Fetal Wellbeing:  Category I Pain Control:  desires Epidural, IV Nubain for toothache I/D:  n/a Anticipated MOD:  NSVD  DE LA CRUZ,Yama Nielson 01/23/2011, 12:07 PM

## 2011-01-23 NOTE — Progress Notes (Signed)
Stacey Blake is a 30 y.o. [id number] at [redacted]w[redacted]d by ultrasound admitted for PROM  Subjective: Called to see pt due to prolonged deceleration lasting over 10 minutes with recovery to baseline, but followed with repetitive variables with each contraction. Patient interventions included O2, trendelenburg, terbutaline and position changes.  Objective: BP 93/44  Pulse 69  Temp(Src) 98 F (36.7 C) (Oral)  Resp 18  Ht 5' 10.5" (1.791 m)  Wt 79.833 kg (176 lb)  BMI 24.90 kg/m2  LMP 04/24/2010  Breastfeeding? Unknown      FHT:  FHR: 125 bpm, variability: moderate,  accelerations:  Abscent,  decelerations:  Present  UC:   diminshed s/p terbutalene SVE:   Dilation: 2.5 Effacement (%): 50 Station: -3 Exam by:: williams, cnm Reconfirmed by JVF Labs: Lab Results  Component Value Date   WBC 11.1* 01/23/2011   HGB 11.2* 01/23/2011   HCT 34.9* 01/23/2011   MCV 89.5 01/23/2011   PLT 206 01/23/2011    Assessment / Plan:Pregnancy 36 + weeks, Nonreassuring FEtal heart pattern. To be taken for urgent cesarean section Procedure reviewed with patient including risks, conplications including injury to bladder, bowel or adjacent organs. Or transfusion. Patient agrees to cesarean section. nonreassuring fetal heart status.  Labor: proceed to cesarean section Preeclampsia:   Fetal Wellbeing:  Category II Pain Control:  spinal for cesarean I/D:   Anticipated MOD:  cesarean  Alfard Cochrane V 01/23/2011, 7:35 PM

## 2011-01-23 NOTE — Brief Op Note (Addendum)
01/23/2011  8:57 PM  PATIENT:  Stacey Blake  30 y.o. female  PRE-OPERATIVE DIAGNOSIS:  non-reassuring fetal heart rate  POST-OPERATIVE DIAGNOSIS:  non-reassuring fetal heart rate, body cord x 2  PROCEDURE:  Procedure(s): CESAREAN SECTION  SURGEON:  Surgeon(s): Tilda Burrow, MD  PHYSICIAN ASSISTANT:   ASSISTANTS: none   ANESTHESIA:   spinal  EBL:  Total I/O In: 900 [I.V.:900] Out: 1200 [Urine:400; Blood:800]  Possibly 1000 cc  BLOOD ADMINISTERED:none  DRAINS: Urinary Catheter (Foley)   LOCAL MEDICATIONS USED:  NONE  SPECIMEN:  No Specimen  DISPOSITION OF SPECIMEN:  placenta to l & d  COUNTS:  YES  TOURNIQUET:  * No tourniquets in log *  DICTATION: .Dragon Dictation  PLAN OF CARE: Admit to inpatient   PATIENT DISPOSITION:  PACU - hemodynamically stable.   Delay start of Pharmacological VTE agent (>24hrs) due to surgical blood loss or risk of bleeding:  {YES/NO/NOT APPLICABLE:20182 NOTE;  Patient had some bleeding from the left uterine angle requiring reopening of uterine incision.and suturing of left uterine vessels.

## 2011-01-23 NOTE — Progress Notes (Signed)
Dr. Emelda Fear discussing urgent c/s with pt. Pt agrees.

## 2011-01-23 NOTE — ED Provider Notes (Signed)
Stacey Blake is a 30 y.o. year old [phone] female at [redacted]w[redacted]d weeks gestation who presents to MAU reporting leaking a moderate amount of pale yellow fluid soaking 2 pads at 2:30 am. She reports pos FM and mild UC's and denies VB. She has not received any PNC this pregnancy except for MAU visit last week due to not knowing she was pregnant. She had an anatomy US and all testing except for GBS and 1 hour GTT at that visit.  History OB History    Grav Para Term Preterm Abortions TAB SAB Ect Mult Living   6 4 1 3 1  1  1 3      Past Medical History  Diagnosis Date  . Preterm labor    Preterm birth Past Surgical History  Procedure Date  . Wrist ganglion excision 2007 x2  . Dilation and curettage of uterus    Family History: family history is not on file. Social History:  reports that she has been smoking Cigarettes.  She has a 3 pack-year smoking history. She has never used smokeless tobacco. She reports that she uses illicit drugs (Marijuana and Cocaine). She reports that she does not drink alcohol.  ROS: Otherwise neg  Dilation: 2 Effacement (%): 50 Station: -3;Ballotable Exam by:: Ivonne Andrew CNM  Blood pressure 128/71, pulse 84, temperature 98.1 F (36.7 C), temperature source Oral, resp. rate 18, last menstrual period 04/24/2010, unknown if currently breastfeeding. Maternal Exam:  Introitus: Vagina is positive for vaginal discharge (mucus).    Physical Exam  Constitutional: She is oriented to person, place, and time. She appears well-developed and well-nourished. No distress.  Eyes: Pupils are equal, round, and reactive to light.  Cardiovascular: Normal rate.   Respiratory: Effort normal.  GI: Soft.  Genitourinary: There is no rash on the right labia. There is no rash on the left labia. Uterus is not tender. No erythema or bleeding around the vagina. Vaginal discharge (mucus) found.  Neurological: She is alert and oriented to person, place, and time. She has normal reflexes.  Skin:  Skin is warm and dry.  Psychiatric: She has a normal mood and affect.    Prenatal labs: ABO, Rh: --/--/O POS (11/16 2025) Antibody:    Rubella: 64.7 (11/24 0243) Immune RPR: NON REACTIVE (11/24 0243)  HBsAg: NEGATIVE (11/24 0243)  HIV:   Non-reactive (11/24 0000) GBS:   Unknown, collected  Fern: equivocal Amnisure: pos  Assessment/Plan: Assessment: 1. Labor: PPROM 2. Fetal Wellbeing: Category I  3. Pain Control: none 4. GBS: Pending 5. 36.0 week IUP  Plan:  1. Admit to BS per consult with MD 2. Routine L&D orders 3. Expectant management and augmentation PRN 4. UDS 5. PCN for unknown GBS  Jeanee Fabre 01/23/2011 08:30 AM         Chai Routh 01/23/2011, 7:42 AM

## 2011-01-23 NOTE — Op Note (Signed)
Pre-operative Diagnosis:nonreassuring fetal heart rates the* No Diagnosis Codes entered *  Post-operative Diagnosis: same, and delivered  Surgeon: Surgeon(s) and Role:    * Tilda Burrow, MD - Primary   Procedure and Anesthesia:  Procedure(s) and Anesthesia Type:    * CESAREAN SECTION - Spinal  ASA Class: 1 Complications   persistent bleedingfrom the left uterine incision and requiring reopening of the lower uterine segment closure and re\re suturing Estimated blood loss of [phone] Findings healthy female infant Apgars 8 and 9 with body cord x2, and minimal amniotic fluid, thin cord wrapped around the body shoulder and the leg. Details of procedure Patient was taken operating room spinal anesthesia introduced and adequate analgesic levels confirmed. Transverse lower abdominal incision performed and method of Pfannenstiel, bladder flap developed the lower uterine segment, transverse uterine incision made over the fetal vertex which was applied the fetal vertex delivered through a stab incision, with body cord x2 identified looping over the anterior shoulder body and leg. Placenta delivered in response to Coude uterine massage. Ring forceps was placed on the the incision and then the uterine incision closed with running locking 0 chromic there was persistent oozing from the midline and 2 interrupted sutures were placed on the inferior margin of the uterine incision of the tissues did not appear care to be exceptionally vascular. When this did not improve in the uterine incision was reopened and there was identified in the area of bleeding from the inner aspects of the left corner worsen uterine vessels had been transected and had opened up, and or bleeding in 2 figure-of-eight sutures were placed in the left uterine angle and after persistent oozing care was taken to encircle the uterine vessels above and below the uterine incision on the left side and hemostasis dramatically improved uterus  incision was then closed using running locking 0 chromic with good hemostasis bladder flap was reapproximated.  Pelvis was irrigated as the uterus had been, then the anterior peritoneum closed with 2-0 chromic the fascia closed with running 0 Vicryl the subcutaneous tissues reapproximated with interrupted 2-0 Vicryl, then subcuticular 3-0 Vicryl used to close the skin incision with good surgical results. Sponge and needle counts were correct. Estimated blood loss [phone] cc

## 2011-01-23 NOTE — Progress Notes (Signed)
Stacey Blake is a 30 y.o. [id number] at [redacted]w[redacted]d by ultrasound admitted for PROM  Subjective: Patient feeling more pressure than before, but not ready for epidural yet.  No complaints at this time.  Objective: BP 116/61  Pulse 68  Temp(Src) 97.8 F (36.6 C) (Oral)  Resp 18  Ht 5' 10.5" (1.791 m)  Wt 79.833 kg (176 lb)  BMI 24.90 kg/m2  LMP 04/24/2010  Breastfeeding? Unknown      FHT:  FHR: 130s bpm, variability: minimal to moderate ,  accelerations:  Present,  decelerations:  Absent UC:   irregular, every 3-4 minutes SVE:   Dilation: 2.5 Effacement (%): 50 Station: -3 Exam by:: [facility] [employer]: Lab Results  Component Value Date   WBC 11.1* 01/23/2011   HGB 11.2* 01/23/2011   HCT 34.9* 01/23/2011   MCV 89.5 01/23/2011   PLT 206 01/23/2011    Assessment / Plan: Augmentation of labor, progressing well  Labor: Progressing on Pitocin 8 units, will continue to increase as tolerated Preeclampsia:  n/a Fetal Wellbeing:  Category I Pain Control:  Labor support without medications I/D:  n/a Anticipated MOD:  NSVD  DE LA CRUZ,Chloe Bluett 01/23/2011, 4:19 PM

## 2011-01-23 NOTE — OR Nursing (Signed)
Fundal massage DLWegner RN 

## 2011-01-23 NOTE — Anesthesia Postprocedure Evaluation (Signed)
Anesthesia Post Note  Patient: Stacey Blake  Procedure(s) Performed:  CESAREAN SECTION - primary cesarean section of baby girl at 86  APGAR 8/9  Anesthesia type: Spinal  Patient location: PACU  Post pain: Pain level controlled  Post assessment: Post-op Vital signs reviewed  Last Vitals:  Filed Vitals:   01/23/11 2130  BP: 117/72  Pulse: 67  Temp:   Resp: 18    Post vital signs: Reviewed  Level of consciousness: awake  Complications: No apparent anesthesia complications

## 2011-01-23 NOTE — Progress Notes (Signed)
Patient is brought in by ems with c/o rupture of membranes at about 0230am. She states that the fluids is clear and has changed saturated pad twice since. She denies any pain, vaginal bleeding. She reports good fetal movement.

## 2011-01-23 NOTE — Anesthesia Preprocedure Evaluation (Addendum)
Anesthesia Evaluation  Patient identified by MRN, date of birth, ID band Patient awake    Reviewed: Allergy & Precautions, H&P , NPO status , Patient's Chart, lab work & pertinent test results, reviewed documented beta blocker date and time   History of Anesthesia Complications Negative for: history of anesthetic complications  Airway Mallampati: II TM Distance: >3 FB Neck ROM: full    Dental  (+) Poor Dentition and Chipped   Pulmonary neg pulmonary ROS, Current Smoker,  clear to auscultation        Cardiovascular neg cardio ROS regular Normal    Neuro/Psych Negative Neurological ROS  Negative Psych ROS   GI/Hepatic negative GI ROS, Neg liver ROS,   Endo/Other  Negative Endocrine ROS  Renal/GU negative Renal ROS  Genitourinary negative   Musculoskeletal   Abdominal   Peds  Hematology negative hematology ROS (+)   Anesthesia Other Findings Drug use - MJ, cocaine  Reproductive/Obstetrics (+) Pregnancy (no PNC, h/o SVD x3)                           Anesthesia Physical Anesthesia Plan  ASA: II and Emergent  Anesthesia Plan: Spinal   Post-op Pain Management:    Induction:   Airway Management Planned:   Additional Equipment:   Intra-op Plan:   Post-operative Plan:   Informed Consent: I have reviewed the patients History and Physical, chart, labs and discussed the procedure including the risks, benefits and alternatives for the proposed anesthesia with the patient or authorized representative who has indicated his/her understanding and acceptance.     Plan Discussed with: CRNA and Surgeon  Anesthesia Plan Comments:         Anesthesia Quick Evaluation

## 2011-01-23 NOTE — Transfer of Care (Signed)
Immediate Anesthesia Transfer of Care Note  Patient: Stacey Blake  Procedure(s) Performed:  CESAREAN SECTION - primary cesarean section of baby girl at 55  APGAR 8/9  Patient Location: PACU  Anesthesia Type: Spinal  Level of Consciousness: awake, alert  and oriented  Airway & Oxygen Therapy: Patient Spontanous Breathing  Post-op Assessment: Report given to PACU RN and Post -op Vital signs reviewed and stable  Post vital signs: stable  Complications: No apparent anesthesia complications

## 2011-01-24 LAB — CBC
HCT: 25.9 % — ABNORMAL LOW (ref 36.0–46.0)
Hemoglobin: 8.5 g/dL — ABNORMAL LOW (ref 12.0–15.0)
MCHC: 32.8 g/dL (ref 30.0–36.0)
MCV: 89.3 fL (ref 78.0–100.0)
RDW: 15.7 % — ABNORMAL HIGH (ref 11.5–15.5)

## 2011-01-24 MED ORDER — IBUPROFEN 600 MG PO TABS: 600.0000 mg | ORAL_TABLET | Freq: Four times a day (QID) | ORAL | Status: AC | PRN

## 2011-01-24 NOTE — Anesthesia Postprocedure Evaluation (Signed)
  Anesthesia Post-op Note  Patient: Stacey Blake  Procedure(s) Performed:  CESAREAN SECTION - primary cesarean section of baby girl at 40  APGAR 8/9  Patient Location: PACU and Mother/Baby  Anesthesia Type: Spinal  Level of Consciousness: awake, alert  and oriented  Airway and Oxygen Therapy: Patient Spontanous Breathing  Post-op Assessment: Patient's Cardiovascular Status Stable and Respiratory Function Stable  Post-op Vital Signs: Reviewed and stable  Complications: No apparent anesthesia complications Pt. On mother/baby not PACU

## 2011-01-24 NOTE — Progress Notes (Signed)
I have seen/examined this patient and agree with the students assessment and plan. Additionally, will saline lock IV and d/c foley now.   Stacey Blake E.

## 2011-01-24 NOTE — Addendum Note (Signed)
Addendum  created 01/24/11 1123 by Edison Pace, CRNA   Modules edited:Notes Section

## 2011-01-24 NOTE — Progress Notes (Signed)
Subjective: Postpartum Day 1: Cesarean Delivery Patient reports incisional pain, tolerating PO and + flatus.  Mild abdominal pain and clots in vaginal blood. Denies dizziness and presyncope.  Objective: Vital signs in last 24 hours: Temp:  [97.5 F (36.4 C)-98.3 F (36.8 C)] 98.3 F (36.8 C) (12/02 0500) Pulse Rate:  [65-82] 77  (12/02 0500) Resp:  [16-20] 18  (12/02 0500) BP: (93-131)/(44-78) 103/55 mmHg (12/02 0500) SpO2:  [98 %-100 %] 100 % (12/02 0500) Weight:  [79.833 kg (176 lb)] 176 lb (79.833 kg) (12/01 1019)  Physical Exam:  General: alert, cooperative and no distress Heart: Regular rate and rhythm. No murmurs, rubs, gallops. Lungs: clear to auscultation bilaterally. No wheezes, rhonchi, crackles. Abdomen: tender suprapubically. No upper abdominal tenderness, soft. Lochia: appropriate Uterine Fundus: firm Incision: no significant drainage, no dehiscence, no significant erythema DVT Evaluation: No cords or calf tenderness.   Basename 01/24/11 0505 01/23/11 0950  HGB 8.5* 11.2*  HCT 25.9* 34.9*    Assessment/Plan: Status post Cesarean section. Doing well postoperatively. Monitor HGB/HCT and blood pressure. Lowest BP overnight was 97/54. Patient denies syncope, dizziness, or feeling faint.  Continue current care.  Mosetta Putt, PA-S 01/24/2011, 8:38 AM

## 2011-01-25 LAB — CBC
MCH: 28.6 pg (ref 26.0–34.0)
MCHC: 32.1 g/dL (ref 30.0–36.0)
Platelets: 155 10*3/uL (ref 150–400)
RDW: 15.7 % — ABNORMAL HIGH (ref 11.5–15.5)

## 2011-01-25 MED ORDER — FERROUS SULFATE 325 (65 FE) MG PO TABS: 325.0000 mg | ORAL_TABLET | Freq: Three times a day (TID) | ORAL | Status: DC

## 2011-01-25 MED ORDER — OXYCODONE-ACETAMINOPHEN 10-325 MG PO TABS: 1.0000 | ORAL_TABLET | ORAL | Status: AC | PRN

## 2011-01-25 MED ORDER — INFLUENZA VIRUS VACC SPLIT PF IM SUSP
0.5000 mL | INTRAMUSCULAR | Status: AC
  Administered 2011-01-26: 0.5 mL via INTRAMUSCULAR
  Filled 2011-01-25: qty 0.5

## 2011-01-25 MED ORDER — IBUPROFEN 600 MG PO TABS: 600.0000 mg | ORAL_TABLET | Freq: Four times a day (QID) | ORAL | Status: AC | PRN

## 2011-01-25 NOTE — Progress Notes (Signed)
UR chart review completed.  

## 2011-01-25 NOTE — Progress Notes (Signed)
PSYCHOSOCIAL ASSESSMENT ~ MATERNAL/CHILD Name: Stacey Blake                                                                                      Age: 30   Referral Date: 01/25/11 Reason/Source: NPNC, Hx of Cocaine and MJ use/CN  I. FAMILY/HOME ENVIRONMENT A. Child's Legal Guardian __x_Parent(s) ___Grandparent ___Foster parent ___DSS_________________ Name: Si Raider                                        DOB: [birth year]         Age: 83   Address: [address] [place], [place] [address]  Name: Ed Blalock                           DOB: //                     Age:   Address: Does not live with [name]  B. Other Household Members/Support Persons Name: Ta'Shawn                        Relationship: brother                       DOB ___/___/___                   Name: Jordan                                Relationship: brother                       DOB ___/___/___                   Name: Zavion                             Relationship: brother                       DOB ___/___/___                   Name:                                         Relationship:                        DOB ___/___/___  C. Other Support: MGM   II. PSYCHOSOCIAL DATA A. Information Source  _x_Patient Interview  __Family Interview           __Other___________  B. Event organiser __Employment: __Medicaid    [place]:                 __Private Insurance:                   __Self Pay  __Food Stamps   __WIC __[employer]     __Public Housing     __Section 8    __Maternity Care Coordination/Child Service Coordination/Early Intervention  __School:                                                                         Grade:  __Other:   Salena Saner Cultural and Environment Information Cultural Issues Impacting Care: none known  III. STRENGTHS _x__Supportive family/friends _x__Adequate Resources ___Compliance  with medical plan _x__Home prepared for Child (including basic supplies) ___Understanding of illness      ___Other: IV. RISK FACTORS AND CURRENT PROBLEMS         ____No Problems Noted                                                                                                                                                                                                                                                Pt              Family         Substance Abuse                                                                   _x__              ___            Mental Illness  ___              ___  Family/Relationship Issues                                      ___               ___             Abuse/Neglect/Domestic Violence                                         ___         ___  Financial Resources                                        ___              ___             Transportation                                                                        ___               ___  DSS Involvement                                                                   ___              ___  Adjustment to Illness                                                               ___              ___  Knowledge/Cognitive Deficit                                                   ___              ___             Compliance with Treatment                                                 ___                ___  Basic Needs (food, housing, etc.)                                          ___              ___             Housing Concerns                                       ___              ___ Other_____________________________________________________________            V. SOCIAL WORK ASSESSMENT SW met with [name] in her first floor room to complete assessment.  SW inquired about why [name] did not receive PNC.  She states that she did not know she was  pregnant until approximately 2 weeks ago.  She states that she never missed a period and that she took a few pregnancy tests that were negative.  She states she took another test 2 weeks ago that was positive.  She feels sad that she did not get a chance to bond with this baby in utero as she did her other children, but states she is happy about baby.  She was holding baby and talking to baby during assessment.  She also feels bad that she did not receive PNC or take PNV during her pregnancy.  She states that she and FOB are not together, but she thinks he is excited about baby.  This is his first child.  She told SW that her friends and family are getting together everything she needs for baby at home and that she has a good support system.  SW heard her on the phone upon arrival to her room talking to someone about a speech therapist who would be coming out to the home.  [name] explained to SW that her mother is taking care of her three boys while she is in the hospital.  SW inquired about her hx of MJ and cocaine use.  [name] seemed very surprised that SW knew this and admitted to MJ use, but states her last use was approximately 2-3 weeks before she found out she was pregnant.  She first told SW that she has never used cocaine.  She then said she hasn't used since she was 30 years old, and left it that she has not used since 2007 and has had "no use to her knowledge," since then.  SW asked how she might have used unknowingly and she said because she has smoked some blunts that were rolled for her.  SW explained hospital drug screen policy and mandated reports to the Department of Social Services for positive screens.  [name] began to cry.  SW explained that the baby's urine was negative and that Child Protective Services will be involved if the meconium is positive for MJ, but that SW has never seen DSS remove a child for MJ alone.  [name] states no past involvement with CPS.  SW called [place] CPS to see if she has an  open case.  They confirmed that she does not.  VI. SOCIAL WORK PLAN  ___No Further Intervention Required/No Barriers to Discharge   ___Psychosocial Support and Ongoing Assessment of Needs   ___Patient/Family  Education:   ___Child Protective Services Report   County___________ Date___/____/____   ___Information/Referral to [employer]_________________________   _x__Other: SW will monitor meconium drug screen results and make CPS report if necessary.

## 2011-01-25 NOTE — Progress Notes (Signed)
Subjective: Postpartum Day 2: Cesarean Delivery Patient reports no problems voiding.    Objective: Vital signs in last 24 hours: Temp:  [97.3 F (36.3 C)-98.8 F (37.1 C)] 98.5 F (36.9 C) (12/03 0539) Pulse Rate:  [69-93] 89  (12/03 0539) Resp:  [18-20] 18  (12/03 0539) BP: (104-137)/(59-67) 115/59 mmHg (12/03 0539) SpO2:  [98 %-100 %] 100 % (12/02 2035)  Physical Exam:  General: alert, cooperative and no distress Lochia: appropriate Uterine Fundus: firm Incision: healing well, no significant drainage, no significant erythema, Sutures with steri-strips C/D/I DVT Evaluation: No evidence of DVT seen on physical exam. No cords or calf tenderness.   Basename 01/25/11 0525 01/24/11 0505  HGB 7.8* 8.5*  HCT 24.3* 25.9*    Assessment/Plan: Status post Cesarean section. Doing well postoperatively.  Anemia Discharge home with standard precautions and return to clinic in 4-6 weeks Rx percocet and iron.  Stacey Blake E. 01/25/2011, 6:39 AM

## 2011-01-25 NOTE — Discharge Summary (Signed)
Obstetric Discharge Summary Reason for Admission: onset of labor Prenatal Procedures: NST and ultrasound Intrapartum Procedures: cesarean: low cervical, transverse Postpartum Procedures: none Complications-Operative and Postpartum: none Hemoglobin  Date Value Range Status  01/25/2011 7.8* 12.0-15.0 (g/dL) Final     HCT  Date Value Range Status  01/25/2011 24.3* 36.0-46.0 (%) Final    Discharge Diagnoses: Premature labor and POD# 2 s/p PLTCS for [facility]  Discharge Information: Date: 01/25/2011 Activity: pelvic rest Diet: routine Medications: Ibuprofen and Percocet Condition: stable Instructions: refer to practice specific booklet Discharge to: home Follow-up Information    Follow up with [facility]. Call in 4 weeks.   Contact information:   [address] [place] [address]          Newborn Data: Live born female  Birth Weight: 5 lb 8.7 oz (2515 g) APGAR: 8, 9  Home with mother.  SHORES,SUZANNE E. 01/25/2011, 6:46 AM  Addendum: Patient stayed one more day because baby was not cleared. No changed to discharge summary.   Tana Conch 01/26/2011 7:51 AM

## 2011-01-26 ENCOUNTER — Encounter (HOSPITAL_COMMUNITY): Payer: Self-pay | Admitting: Obstetrics and Gynecology

## 2011-01-26 LAB — CULTURE, BETA STREP (GROUP B ONLY)

## 2011-01-26 NOTE — H&P (Signed)
See MAU note.  Katrinka Blazing, Jhoanna Heyde 01/23/2011 8:30 AM

## 2011-01-26 NOTE — Progress Notes (Signed)
Patient ID: Stacey Blake, female   DOB: 05/05/1980, 30 y.o.   MRN: [id number] Subjective: Postpartum Day 3: Cesarean Delivery Patient reports incisional pain, tolerating PO, + flatus and no problems voiding.    Objective: Vital signs in last 24 hours: Temp:  [98.1 F (36.7 C)-98.4 F (36.9 C)] 98.1 F (36.7 C) (12/04 0535) Pulse Rate:  [76-92] 76  (12/04 0535) Resp:  [18-20] 18  (12/04 0535) BP: (111-123)/(66-76) 111/76 mmHg (12/04 0535) SpO2:  [100 %] 100 % (12/03 1500)  Physical Exam:  General: alert, cooperative and no distress Lochia: appropriate Uterine Fundus: firm Incision: healing well, no significant drainage, no significant erythema, Sutures with steri-strips C/D/I DVT Evaluation: No evidence of DVT seen on physical exam. No cords or calf tenderness.   Basename 01/25/11 0525 01/24/11 0505  HGB 7.8* 8.5*  HCT 24.3* 25.9*    Assessment/Plan: Status post Cesarean section. Doing well postoperatively.  Anemia Discharge home with standard precautions and return to clinic in 4-6 weeks Rx percocet and iron. Mainly bottle feeding says may try some breastfeeding.  Wants BTL but pending medicaid paperwork  Colton, Jeannett Senior 01/26/2011, 7:49 AM

## 2011-01-29 NOTE — ED Provider Notes (Signed)
Attestation of Attending Supervision of Advanced Practitioner: Evaluation and management procedures were performed by the PA/NP/CNM/OB Fellow under my supervision/collaboration. Chart reviewed and agree with management and plan.  Judea Riches V 01/29/2011 9:21 AM

## 2011-03-03 ENCOUNTER — Ambulatory Visit: Payer: Self-pay | Admitting: Obstetrics and Gynecology

## 2011-03-24 ENCOUNTER — Encounter: Payer: Self-pay | Admitting: Advanced Practice Midwife

## 2011-03-24 ENCOUNTER — Ambulatory Visit (INDEPENDENT_AMBULATORY_CARE_PROVIDER_SITE_OTHER): Payer: Medicaid Other | Admitting: Advanced Practice Midwife

## 2011-03-24 DIAGNOSIS — Z309 Encounter for contraceptive management, unspecified: Secondary | ICD-10-CM

## 2011-03-24 DIAGNOSIS — IMO0001 Reserved for inherently not codable concepts without codable children: Secondary | ICD-10-CM

## 2011-03-24 LAB — POCT PREGNANCY, URINE: Preg Test, Ur: NEGATIVE

## 2011-03-24 MED ORDER — MEDROXYPROGESTERONE ACETATE 150 MG/ML IM SUSP
150.0000 mg | Freq: Once | INTRAMUSCULAR | Status: AC
  Administered 2011-03-24: 150 mg via INTRAMUSCULAR

## 2011-03-24 NOTE — Patient Instructions (Signed)
Sterilization, Women Sterilization is a surgical procedure. This surgery permanently prevents pregnancy in women. This can be done by tying (with or without cutting) the fallopian tubes or burning the tubes closed (tubal ligation). Tubal ligation blocks the tubes and prevents the egg from being fertilized by the sperm. Sterilization can be done by removing the ovaries that produce the egg (castration) as well. Sterilization is considered safe with very rare complications. It does not affect menstrual periods, sexual desire, or performance.  Since sterilization is considered permanent, you should not do it until you are sure you do not want to have more children. You and your partner should fully agree to have the procedure. Your decision to have the procedure should not be made when you are in a stressful situation. This can include a loss of a pregnancy, illness or death of a spouse, or divorce. There are other means of preventing unwanted pregnancies that can be used until you are completely sure you want to be sterilized. Sterilization does not protect against sexually transmitted disease. Women who had a sterilization procedure and want it reversed must know that it requires an expensive and major operation. The reversal may not be successful and has a high rate of tubal (ectopic) pregnancy that can be dangerous and require surgery. There are several ways to perform a tubal sterlization:  Laparoscopy. The abdomen is filled with a gas to see the pelvic organs. Then, a tube with a light attached is inserted into the abdomen through 2 small incisions. The fallopian tubes are blocked with a ring, clip or electrocautery to burn closed the tubes. Then, the gas is released and the small incisions are closed.   Hysteroscopy. A tube with a light is inserted in the vagina, through the cervix and then into the uterus. A spring-like instrument is inserted into the opening of the fallopian tubes. The spring causes  scaring and blocks the tubes. Other forms of contraception should be used for three months at which time an X-ray is done to be sure the tubes are blocked.   Minilaparotomy. This is done right after giving birth. A small incision is made under the belly button and the tubes are exposed. The tubes can then be burned, tied and/or cut.   Tubal ligation can be done during a Cesarean section.   Castration is a surgical procedure that removes both ovaries.  Tubal sterilization should be discussed with your caregiver to answer any concerns you or your partner might have. This meeting will help to decide for sure if the operation is safe for you and which procedure is the best one for you. You can change your mind and cancel the surgery at any time. HOME CARE INSTRUCTIONS   Follow your caregivers instructions regarding diet, rest, work, social and sexual activities and follow up appointments.   Shoulder pain is common following a laparoscopy. The pain may be relieved by lying down flat.   Only take over-the-counter or prescription medicines for pain, discomfort or fever as directed by your caregiver.   You may use lozenges for throat discomfort.   Keep the incisions covered to prevent infection.  SEEK IMMEDIATE MEDICAL CARE IF:   You develop a temperature of 102 F (38.9 C), or as your caregiver suggests.   You become dizzy or faint.   You start to feel sick to your stomach (nausea) or throw up (vomit).   You develop abdominal pain not relieved with over-the-counter medications.   You have redness and puffiness (  swelling) of the cut (incision).   You see pus draining from the incision.   You miss a menstrual period.  Document Released: 07/28/2007 Document Revised: 10/21/2010 Document Reviewed: 07/28/2007 [facility] Patient Information 2012 [place], [place].Contraception Choices Birth control (contraception) can stop pregnancy from happening. Different types of birth control work in  different ways. Some can:  Make the mucus in the cervix thick. This makes it hard for sperm to get into the uterus.   Thin the lining of the uterus. This makes it hard for an egg to attach to the wall of the uterus.   Stop the ovaries from releasing an egg.   Block the sperm from reaching the egg.  Certain types of surgery can stop pregnancy from happening. For women, the sugery closes the fallopian tubes (tubal ligation). For men, the surgery stops sperm from releasing during sex (vasectomy). HORMONAL BIRTH CONTROL Hormonal birth control stops pregnancy by putting hormones into your body. Types of birth control include:  A small tube put under the skin of the upper arm (implant). The tube can stay in place for 3 years.   Shots given every 3 months.   Pills taken every day or once after sex (intercourse).   Patches that are changed once a week.   A ring put into the vagina (vaginal ring). The ring is left in place for 3 weeks and removed for 1 week. Then, a new ring is put in the vagina.  BARRIER BIRTH CONTROL  Barrier birth control blocks sperm from reaching the egg. Types of birth control include:   A thin covering worn on the penis (female condom) during sex.   A soft, loose covering put into the vagina (female condom) before sex.   A rubber bowl that sits over the cervix (diaphragm). The bowl must be made for you. The bowl is put into the vagina before sex. The bowl is left in place for 6 to 8 hours after sex.   A small, soft cup that fits over the cervix (cervical cap). The cup must be made for you. The cup can be left in place for 48 hours after sex.   A sponge that is put into the vagina before sex.   A chemical that kills or blocks sperm from getting into the cervix and uterus (spermicide). The chemical may be a cream, jelly, foam, or pill.  INTRAUTERINE (IUD) BIRTH CONTROL  IUD birth control is a small, T-shaped piece of plastic. The plastic is put inside the uterus. There  are 2 types of IUD:  Copper IUD. The IUD is covered in copper wire. The copper makes a fluid that kills sperm. It can stay in place for 10 years.   Hormone IUD. The hormone stops pregnancy from happening. It can stay in place for 5 years.  NATURAL FAMILY PLANNING BIRTH CONTROL  Natural family planning means not having sex or using barrier birth control when the woman is fertile. A woman can:  Use a calendar to keep track of when she is fertile.   Use a thermometer to measure her body temperature.  Protect yourself against sexual diseases no matter what type of birth control you use. Talk to your doctor about which type of birth control is best for you. Document Released: 12/06/2008 Document Revised: 10/21/2010 Document Reviewed: 06/17/2010 [facility] Patient Information 2012 [place], [place].

## 2011-03-24 NOTE — Progress Notes (Signed)
  Subjective:    Patient ID: Si Raider, female    DOB: [birth year], 30 y.o.   MRN: [id number]  HPI Review of Systems    Objective:   Physical Exam    Assessment & Plan:   Subjective:     Stacey Blake is a 31 y.o. female who presents for a postpartum visit. She is 8 weeks postpartum following a low cervical transverse Cesarean section. I have fully reviewed the prenatal and intrapartum course. The delivery was at 36.0 gestational weeks. Outcome: primary cesarean section, low transverse incision. Anesthesia: epidural. Postpartum course has been uneventful. Baby's course has been uneventful. Baby is feeding by both breast and bottle - Breastfed for 3 weeks, then converted due to poor latch. Bleeding red (menstrual). Bowel function is normal. Bladder function is normal. Patient is not sexually active. Contraception method is Depo-Provera injections. Postpartum depression screening: negative.  The following portions of the patient's history were reviewed and updated as appropriate: allergies, current medications, past family history, past medical history, past social history, past surgical history and problem list.  Review of Systems Pertinent items are noted in HPI.   Objective:    BP 124/74  Pulse 75  Temp(Src) 97.1 F (36.2 C) (Oral)  Resp 16  Ht 5\' 11"  (1.803 m)  Wt 158 lb 3.2 oz (71.759 kg)  BMI 22.06 kg/m2  LMP 03/19/2011  Breastfeeding? No  General:  alert, cooperative and no distress   Breasts:  inspection negative, no nipple discharge or bleeding, no masses or nodularity palpable  Lungs: not examined  Heart:  not examined  Abdomen: soft, non-tender; bowel sounds normal; no masses,  no organomegaly   Vulva:  normal  Vagina: normal vagina  Cervix:  anteverted  Corpus: normal well involuted  Adnexa:  normal adnexa  Rectal Exam: Not performed.        Assessment:    Normal postpartum exam. Pap smear not done at today's visit.  Will do at later visit after  Menses.  Plan:    1. Contraception: Depo-Provera injections 2. Would like to have BTL in about 3 months. 3. Follow up in: several weeks or as needed.   Will schedule in 2-3 months to come back for a pre-op BTL visit/consult.

## 2011-03-29 DIAGNOSIS — Z3009 Encounter for other general counseling and advice on contraception: Secondary | ICD-10-CM

## 2012-01-12 ENCOUNTER — Ambulatory Visit: Payer: Medicaid Other | Admitting: Advanced Practice Midwife

## 2012-01-14 ENCOUNTER — Ambulatory Visit: Payer: Self-pay | Admitting: Obstetrics & Gynecology

## 2012-02-20 ENCOUNTER — Encounter (HOSPITAL_COMMUNITY): Payer: Self-pay | Admitting: Family Medicine

## 2012-02-20 ENCOUNTER — Emergency Department (HOSPITAL_COMMUNITY)
Admission: EM | Admit: 2012-02-20 | Discharge: 2012-02-20 | Disposition: A | Discharge status: Home / Self Care | Payer: Self-pay | Attending: Emergency Medicine | Admitting: Emergency Medicine

## 2012-02-20 DIAGNOSIS — K089 Disorder of teeth and supporting structures, unspecified: Secondary | ICD-10-CM | POA: Insufficient documentation

## 2012-02-20 DIAGNOSIS — K0889 Other specified disorders of teeth and supporting structures: Secondary | ICD-10-CM

## 2012-02-20 MED ORDER — OXYCODONE-ACETAMINOPHEN 5-325 MG PO TABS
1.0000 | ORAL_TABLET | Freq: Once | ORAL | Status: AC
  Administered 2012-02-20: 1 via ORAL
  Filled 2012-02-20: qty 1

## 2012-02-20 MED ORDER — PENICILLIN V POTASSIUM 250 MG PO TABS
500.0000 mg | ORAL_TABLET | Freq: Once | ORAL | Status: AC
  Administered 2012-02-20: 500 mg via ORAL
  Filled 2012-02-20: qty 2

## 2012-02-20 MED ORDER — PENICILLIN V POTASSIUM 500 MG PO TABS: 500.0000 mg | ORAL_TABLET | Freq: Four times a day (QID) | ORAL | Status: DC

## 2012-02-20 MED ORDER — OXYCODONE-ACETAMINOPHEN 5-325 MG PO TABS: 1.0000 | ORAL_TABLET | Freq: Four times a day (QID) | ORAL | Status: DC | PRN

## 2012-02-20 NOTE — ED Provider Notes (Signed)
History    This chart was scribed for [employer]. Rubin Payor, MD, MD by Smitty Pluck, ED Scribe. The patient was seen in room TR05C/TR05C and the patient's care was started at 3:27PM.   CSN: [id number]  Arrival date & time 02/20/12  1401      Chief Complaint  Patient presents with  . Dental Pain    (Consider location/radiation/quality/duration/timing/severity/associated sxs/prior treatment) The history is provided by the patient. No language interpreter was used.   Stacey Blake is a 31 y.o. female who presents to the Emergency Department complaining of constant, moderate right lower dental pain onset 3 days ago. Pt reports that pain is radiating to right ear. Reports that pain is located around area of tooth she had pulled in past. She reports taking OTC medication without relief. She denies trouble breathing, nausea, vomiting, fever, chills and any other pain.   Past Medical History  Diagnosis Date  . Preterm labor     Past Surgical History  Procedure Date  . Wrist ganglion excision 2007 x2  . Dilation and curettage of uterus   . Cesarean section 01/23/2011    Procedure: CESAREAN SECTION;  Surgeon: Tilda Burrow, MD;  Location: [facility];  Service: Gynecology;  Laterality: N/A;  primary cesarean section of baby girl at 51  [facility] 8/9    History reviewed. No pertinent family history.  History  Substance Use Topics  . Smoking status: Passive Smoke Exposure - Never Smoker -- 12 years    Types: Cigarettes  . Smokeless tobacco: Never Used  . Alcohol Use: No     Comment: Occassional     OB History    Grav Para Term Preterm Abortions TAB SAB Ect Mult Living   6 5 1 4 1  1  1 4       Review of Systems  Constitutional: Negative for fever and chills.  HENT: Positive for dental problem.   Respiratory: Negative for shortness of breath.   Gastrointestinal: Negative for nausea and vomiting.  Neurological: Negative for weakness.  All other systems reviewed and are  negative.    Allergies  Hydrocodone  Home Medications   Current Outpatient Rx  Name  Route  Sig  Dispense  Refill  . IBUPROFEN 200 MG PO TABS   Oral   Take 400 mg by mouth every 6 (six) hours as needed. For pain         . OXYCODONE-ACETAMINOPHEN 5-325 MG PO TABS   Oral   Take 1-2 tablets by mouth every 6 (six) hours as needed for pain.   15 tablet   0   . PENICILLIN V POTASSIUM 500 MG PO TABS   Oral   Take 1 tablet (500 mg total) by mouth 4 (four) times daily.   40 tablet   0     BP 111/66  Pulse 72  Temp 98 F (36.7 C)  Resp 18  SpO2 100%  LMP 02/19/2012  Physical Exam  Nursing note and vitals reviewed. Constitutional: She is oriented to person, place, and time. She appears well-developed and well-nourished. No distress.  HENT:  Head: Normocephalic and atraumatic.       right lower most posterior tooth broken off and tender No fluctuance around gumline No swelling of floor of mouth No abscess   Eyes: EOM are normal.  Neck: Neck supple. No tracheal deviation present.  Cardiovascular: Normal rate.   Pulmonary/Chest: Effort normal. No respiratory distress.  Musculoskeletal: Normal range of motion.  Neurological: She is alert and  oriented to person, place, and time.  Skin: Skin is warm and dry.  Psychiatric: She has a normal mood and affect. Her behavior is normal.    ED Course  Procedures (including critical care time)   COORDINATION OF CARE: 3:30 PM Discussed ED treatment with pt     Labs Reviewed - No data to display No results found.   1. Pain, dental       MDM  Dental pain. Broken off tooth. No clear abscess. Will discharge home with antibiotics and pain medication      I personally performed the services described in this documentation, which was scribed in my presence. The recorded information has been reviewed and is accurate.     Juliet Rude. Rubin Payor, MD 02/20/12 [id number]

## 2012-02-20 NOTE — ED Notes (Signed)
Patient refused discharge vitals, patient stated that she was tired of waiting and was ready to go.

## 2012-02-20 NOTE — ED Notes (Signed)
Per pt sts right sided dental pain radiating to her ear since Thursday. sts OTC without relief

## 2012-02-22 MED ORDER — PENICILLIN V POTASSIUM 500 MG PO TABS: 500.0000 mg | ORAL_TABLET | Freq: Four times a day (QID) | ORAL | Status: AC

## 2012-02-22 MED ORDER — OXYCODONE-ACETAMINOPHEN 5-325 MG PO TABS: 1.0000 | ORAL_TABLET | Freq: Four times a day (QID) | ORAL | Status: DC | PRN

## 2012-08-24 ENCOUNTER — Ambulatory Visit: Payer: Self-pay | Admitting: Family Medicine

## 2012-11-05 IMAGING — US US OB COMP +14 WK
1 series · 12 of 28 positions shown · non-contrast
Comparison: none

[Series 1: us ob comp +14 wk · 12 of 52 slices shown]
[im 2/52]
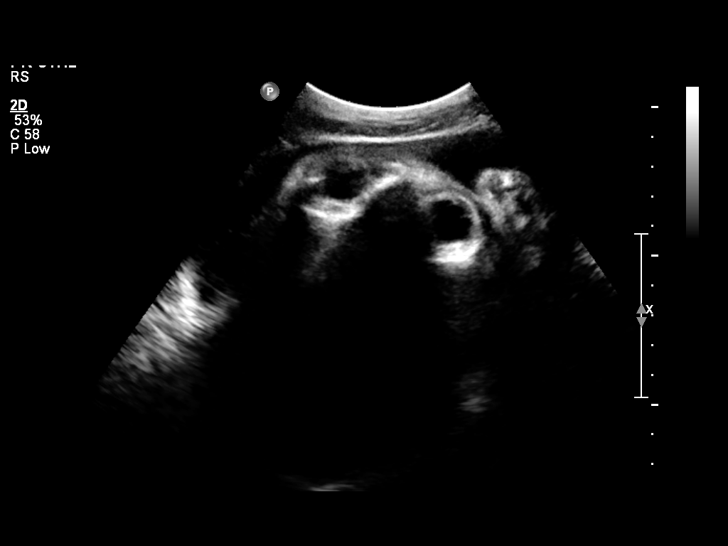
[im 6/52]
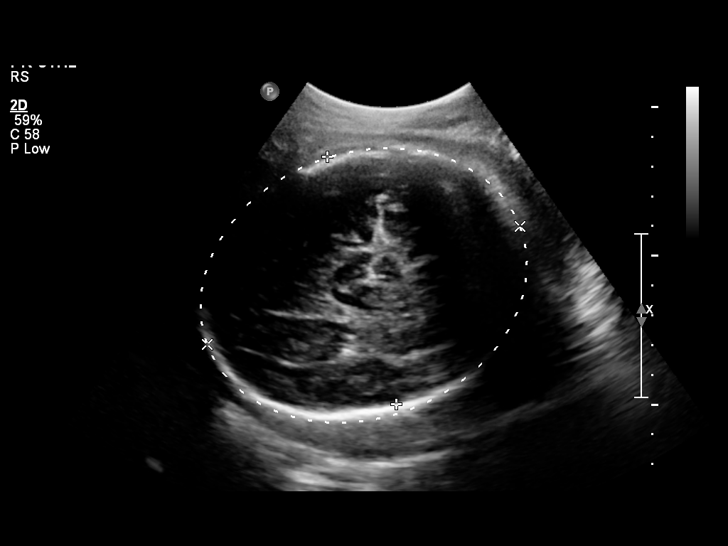
[im 10/52]
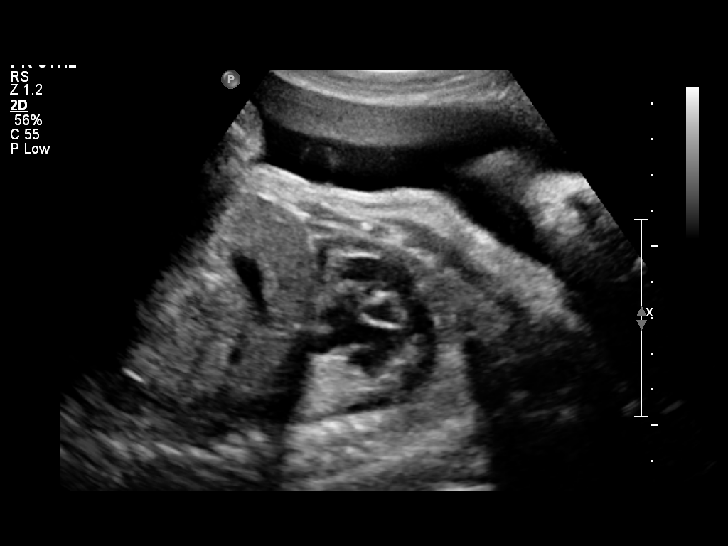
[im 16/52]
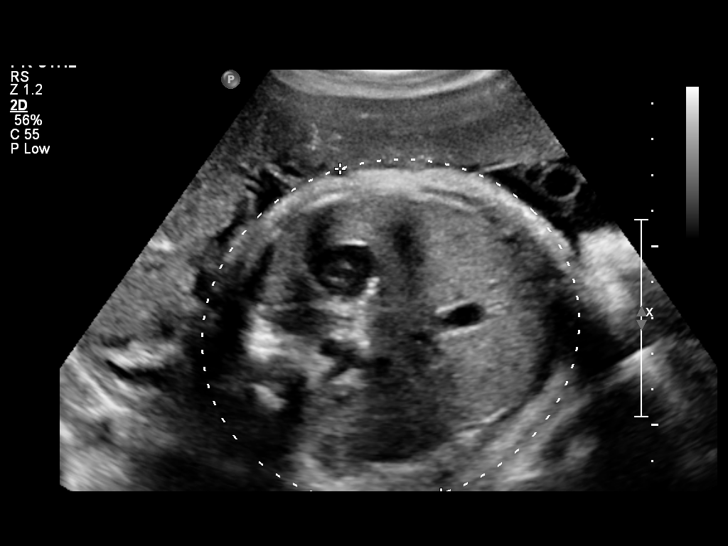
[im 19/52]
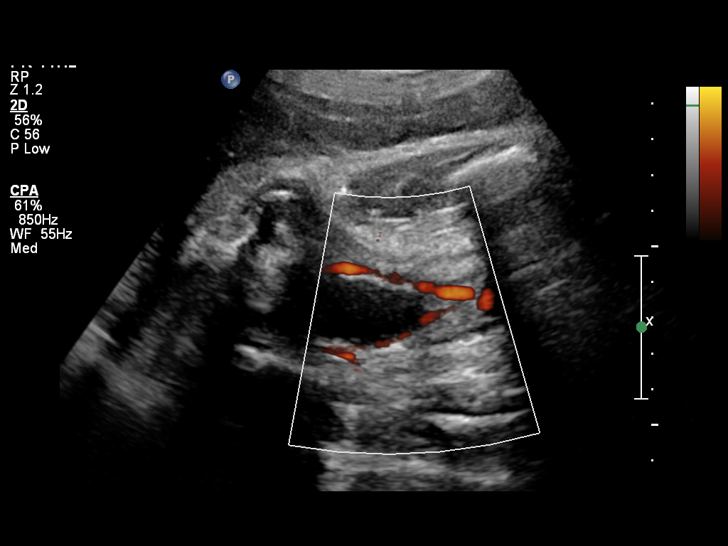
[im 23/52]
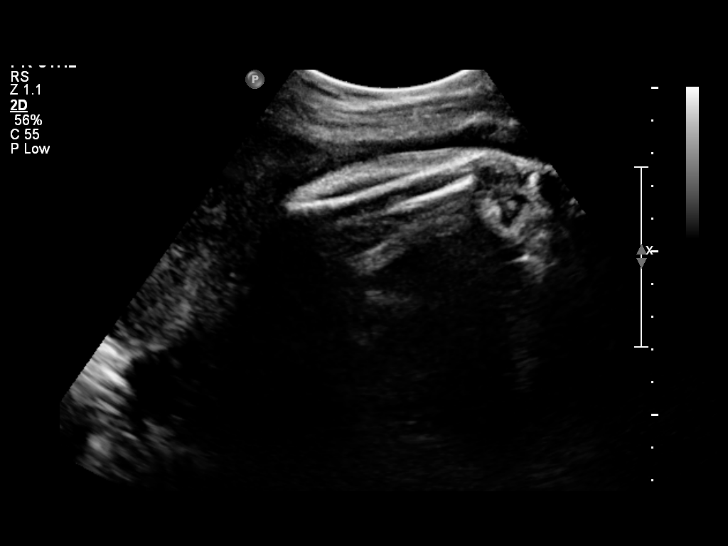
[im 29/52]
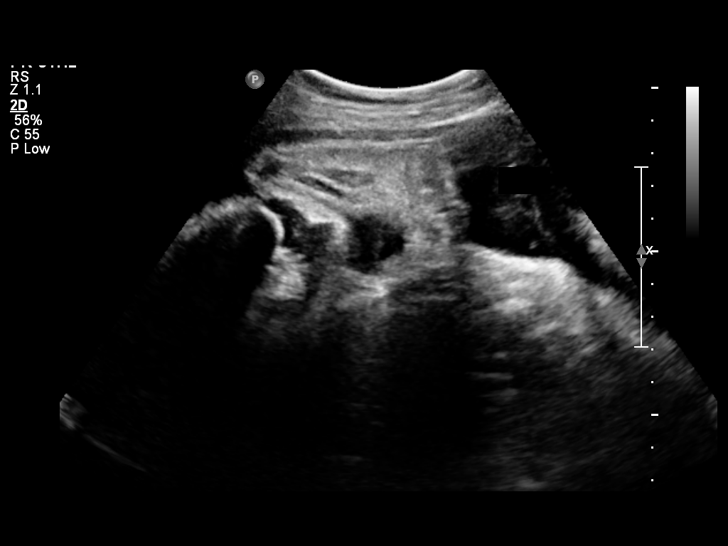
[im 33/52]
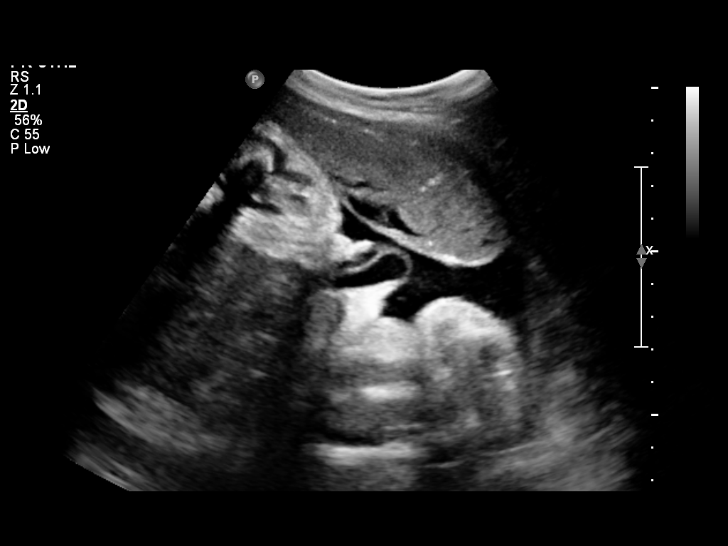
[im 36/52]
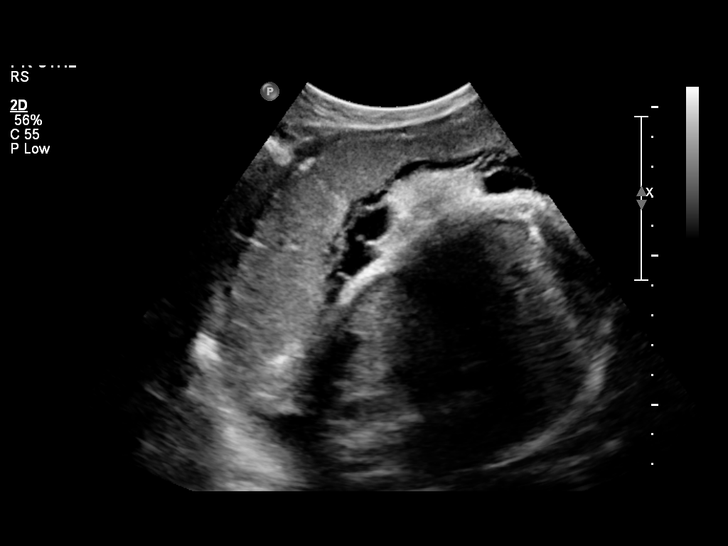
[im 42/52]
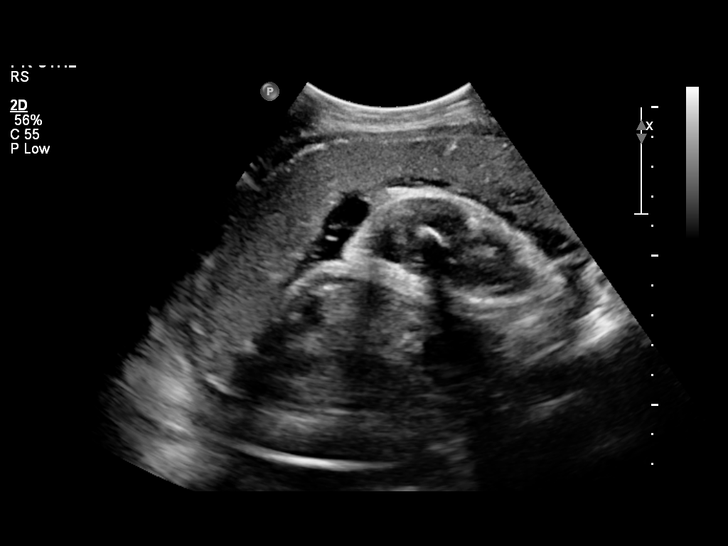
[im 46/52]
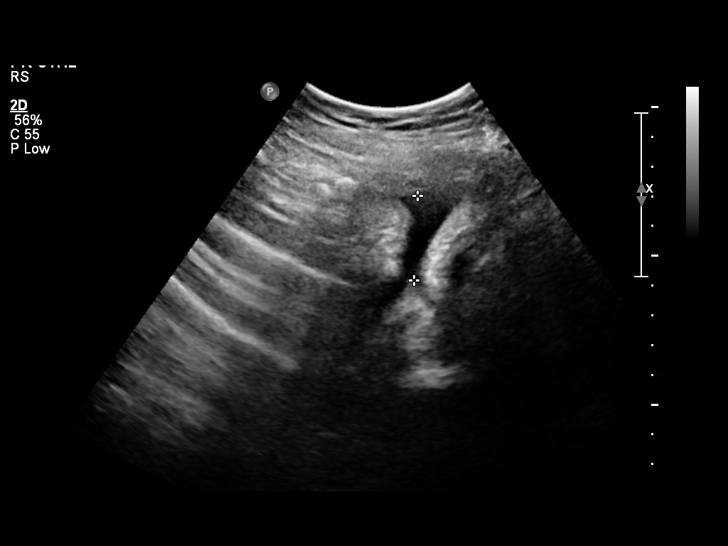
[im 50/52]
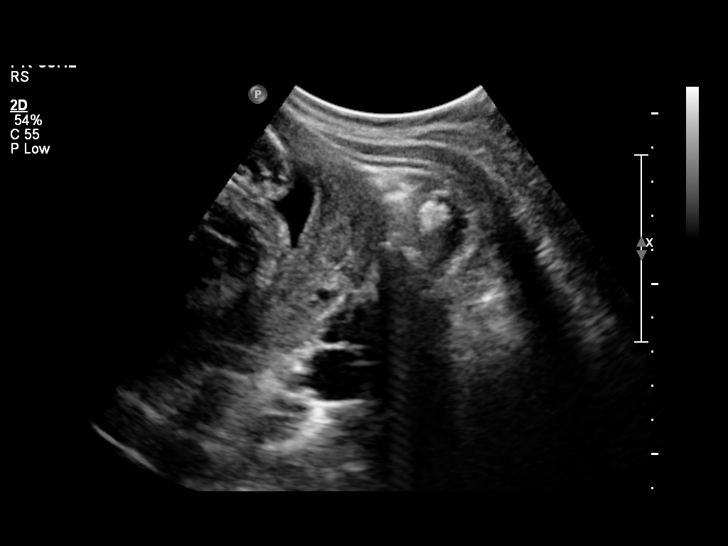

[12 of 28 positions shown; findings below may reference images not displayed]

OBSTETRICS REPORT
                      (Signed Final 01/18/2011 [DATE])

Procedures

 US OB COMP + 14 WK                                    76805.1
Indications

 No or Little Prenatal Care
 Unsure of LMP;  Establish Gestational [AGE]
 Preterm labor (> 22 weeks)
Fetal Evaluation

 Fetal Heart Rate:  169                         bpm
 Cardiac Activity:  Observed
 Presentation:      Cephalic
 Placenta:          Anterior, above cervical os
 P. Cord            Visualized
 Insertion:

 Amniotic Fluid
 AFI FV:      Subjectively within normal limits
 AFI Sum:     12.74   cm      41   %Tile     Larg Pckt:     5.3  cm
 RUQ:   2.38   cm    RLQ:    5.3    cm    LUQ:   2.22    cm   LLQ:    2.84   cm
Biometry

 BPD:     86.4  mm    G. Age:   34w 6d                CI:        73.03   70 - 86
                                                      FL/HC:      20.5   20.1 -

 HC:     321.4  mm    G. Age:   36w 2d       48  %    HC/AC:      1.03   0.93 -

 AC:     311.4  mm    G. Age:   35w 0d       59  %    FL/BPD:     76.3   71 - 87
 FL:      65.9  mm    G. Age:   34w 0d       18  %    FL/AC:      21.2   20 - 24

 Est. FW:    0771  gm      5 lb 9 oz     59  %
Gestational Age

 LMP:           38w 1d       Date:   04/24/10                 EDD:   01/29/11
 U/S Today:     35w 0d                                        EDD:   02/20/11
 Best:          35w 0d    Det. By:   U/S (01/16/11)           EDD:   02/20/11
Anatomy
 Cranium:           Appears normal      Aortic Arch:       Basic anatomy
                                                           exam per order
 Fetal Cavum:       Not well            Ductal Arch:       Appears normal
                    visualized
 Ventricles:        Not well            Diaphragm:         Basic anatomy
                    visualized                             exam per order
 Choroid Plexus:    Not well            Stomach:           Appears
                    visualized                             normal, left
                                                           sided
 Cerebellum:        Not well            Abdomen:           Appears normal
                    visualized
 Posterior Fossa:   Not well            Abdominal Wall:    Appears nml
                    visualized                             (cord insert,
                                                           abd wall)
 Nuchal Fold:       Not applicable      Cord Vessels:      Appears normal
                    (>20 wks GA)                           (3 vessel cord)
 Face:              Orbits appear       Kidneys:           Appear normal
                    normal
 Heart:             Appears normal      Bladder:           Appears normal
                    (4 chamber &
                    axis)
 RVOT:              Appears normal      Spine:             Not well
                                                           visualized
 LVOT:              Appears normal      Limbs:             Four extremities
                                                           seen

 Other:     Technically difficult due to advanced GA and fetal
            position. Fetus appears to be a female.
Cervix Uterus Adnexa

 Cervix:       Not visualized (advanced GA >34 wks)
 Uterus:       No abnormality visualized.

 Left Ovary:   Not visualized.
 Right Ovary:  Not visualized.
 Adnexa:     No abnormality visualized.
Impression

 Single living IUP with US Gest. Age of 35w 0d, and EDD of
 02/20/2011. Note that initial estimates of gestational age in
 the 3rd trimester are less accurate.
 Suboptimal visualization of anatomy due to advanced GA,
 however no fetal anomalies identified.
 Amniotic fluid within normal limits, with AFI of 12.74 cm.

 questions or concerns.

## 2013-02-11 ENCOUNTER — Emergency Department (HOSPITAL_COMMUNITY)
Admission: EM | Admit: 2013-02-11 | Discharge: 2013-02-11 | Disposition: A | Discharge status: Home / Self Care | Payer: Self-pay | Attending: Emergency Medicine | Admitting: Emergency Medicine

## 2013-02-11 ENCOUNTER — Encounter (HOSPITAL_COMMUNITY): Payer: Self-pay | Admitting: Emergency Medicine

## 2013-02-11 DIAGNOSIS — J111 Influenza due to unidentified influenza virus with other respiratory manifestations: Secondary | ICD-10-CM | POA: Insufficient documentation

## 2013-02-11 DIAGNOSIS — Z8751 Personal history of pre-term labor: Secondary | ICD-10-CM | POA: Insufficient documentation

## 2013-02-11 DIAGNOSIS — R197 Diarrhea, unspecified: Secondary | ICD-10-CM | POA: Insufficient documentation

## 2013-02-11 DIAGNOSIS — R112 Nausea with vomiting, unspecified: Secondary | ICD-10-CM | POA: Insufficient documentation

## 2013-02-11 MED ORDER — OSELTAMIVIR PHOSPHATE 75 MG PO CAPS: 75.0000 mg | ORAL_CAPSULE | Freq: Two times a day (BID) | ORAL | Status: DC

## 2013-02-11 MED ORDER — PSEUDOEPHEDRINE-CODEINE-GG 30-10-100 MG/5ML PO SOLN: 10.0000 mL | Freq: Four times a day (QID) | ORAL | Status: DC | PRN

## 2013-02-11 MED ORDER — PROMETHAZINE HCL 25 MG RE SUPP: 25.0000 mg | Freq: Four times a day (QID) | RECTAL | Status: DC | PRN

## 2013-02-11 NOTE — ED Provider Notes (Signed)
CSN: [id number]     Arrival date & time 02/11/13  1749 History   First MD Initiated Contact with Patient 02/11/13 1807     Chief Complaint  Patient presents with  . multiple symptoms    (Consider location/radiation/quality/duration/timing/severity/associated sxs/prior Treatment) HPI Comments: Patient presents to the ER for evaluation of flulike symptoms. Patient has been sick for 2 days. She reports that 2 of her children had similar symptoms earlier in the week, but they got better quickly. Patient reports fever, chills, sweats, severe nasal congestion and pain, sore throat, cough, nausea, vomiting and diarrhea. She reports the diarrhea has resolved and her vomiting is only when she coughs forcefully, no vomiting with eating or drinking, is tolerating by mouth.   Past Medical History  Diagnosis Date  . Preterm labor    Past Surgical History  Procedure Laterality Date  . Wrist ganglion excision  2007 x2  . Dilation and curettage of uterus    . Cesarean section  01/23/2011    Procedure: CESAREAN SECTION;  Surgeon: Tilda Burrow, MD;  Location: [facility];  Service: Gynecology;  Laterality: N/A;  primary cesarean section of baby girl at 34  [facility] 8/9   No family history on file. History  Substance Use Topics  . Smoking status: Passive Smoke Exposure - Never Smoker -- 12 years    Types: Cigarettes  . Smokeless tobacco: Never Used  . Alcohol Use: No     Comment: Occassional    OB History   Grav Para Term Preterm Abortions TAB SAB Ect Mult Living   6 5 1 4 1  1  1 4      Review of Systems  Constitutional: Positive for fever and chills.  HENT: Positive for congestion and sinus pressure.   Respiratory: Positive for cough.   Gastrointestinal: Positive for nausea, vomiting and diarrhea.  All other systems reviewed and are negative.    Allergies  Hydrocodone  Home Medications   Current Outpatient Rx  Name  Route  Sig  Dispense  Refill  . ibuprofen (ADVIL,MOTRIN) 200 MG  tablet   Oral   Take 400 mg by mouth every 6 (six) hours as needed. For pain         . oxyCODONE-acetaminophen (PERCOCET/ROXICET) 5-325 MG per tablet   Oral   Take 1-2 tablets by mouth every 6 (six) hours as needed for pain.   15 tablet   0    BP 106/84  Pulse 104  Temp(Src) 101.8 F (38.8 C)  Resp 20  Ht 5\' 11"  (1.803 m)  Wt 143 lb (64.864 kg)  BMI 19.95 kg/m2  SpO2 100%  LMP 01/19/2013 Physical Exam  Constitutional: She is oriented to person, place, and time. She appears well-developed and well-nourished. No distress.  HENT:  Head: Normocephalic and atraumatic.  Right Ear: Hearing normal.  Left Ear: Hearing normal.  Nose: Right sinus exhibits maxillary sinus tenderness and frontal sinus tenderness. Left sinus exhibits maxillary sinus tenderness and frontal sinus tenderness.  Mouth/Throat: Oropharynx is clear and moist and mucous membranes are normal.  Eyes: Conjunctivae and EOM are normal. Pupils are equal, round, and reactive to light.  Neck: Normal range of motion. Neck supple.  Cardiovascular: Regular rhythm, S1 normal and S2 normal.  Exam reveals no gallop and no friction rub.   No murmur heard. Pulmonary/Chest: Effort normal and breath sounds normal. No respiratory distress. She exhibits no tenderness.  Abdominal: Soft. Normal appearance and bowel sounds are normal. There is no hepatosplenomegaly. There is  no tenderness. There is no rebound, no guarding, no tenderness at McBurney's point and negative Murphy's sign. No hernia.  Musculoskeletal: Normal range of motion.  Neurological: She is alert and oriented to person, place, and time. She has normal strength. No cranial nerve deficit or sensory deficit. Coordination normal. GCS eye subscore is 4. GCS verbal subscore is 5. GCS motor subscore is 6.  Skin: Skin is warm, dry and intact. No rash noted. No cyanosis.  Psychiatric: She has a normal mood and affect. Her speech is normal and behavior is normal. Thought content  normal.    ED Course  Procedures (including critical care time) Labs Review Labs Reviewed - No data to display Imaging Review No results found.  EKG Interpretation   None       MDM  Diagnosis flu syndrome  She presents to the ER for evaluation of flulike illness. Symptoms started 2 days ago. Her children were sick prior to that, likely the source of the illness. Patient has had a constellation of upper respiratory and GI symptoms which is concerning for possible influenza. Her diarrhea has stopped and she is now tolerating liquids by mouth. Her main complaint is severe sinus congestion and the generalized body aches. Patient will be treated with Tamiflu and symptomatic medication for her complaints.    Gilda Crease, MD 02/11/13 1816

## 2013-02-11 NOTE — ED Notes (Signed)
Last ibu 1500

## 2013-02-11 NOTE — ED Notes (Signed)
The pt is c/o being ill for 2 days.  She is hurting all day chills  Fever pain all over her body chest and sinus congestion back pain.  Nose stuffy aching all over her body nov 29th

## 2013-05-03 ENCOUNTER — Encounter (HOSPITAL_COMMUNITY): Payer: Self-pay | Admitting: Emergency Medicine

## 2013-05-03 ENCOUNTER — Emergency Department (HOSPITAL_COMMUNITY): Payer: Self-pay

## 2013-05-03 ENCOUNTER — Emergency Department (HOSPITAL_COMMUNITY)
Admission: EM | Admit: 2013-05-03 | Discharge: 2013-05-03 | Disposition: A | Discharge status: Home / Self Care | Payer: Self-pay | Attending: Emergency Medicine | Admitting: Emergency Medicine

## 2013-05-03 DIAGNOSIS — S93409A Sprain of unspecified ligament of unspecified ankle, initial encounter: Secondary | ICD-10-CM | POA: Insufficient documentation

## 2013-05-03 DIAGNOSIS — S93402A Sprain of unspecified ligament of left ankle, initial encounter: Secondary | ICD-10-CM

## 2013-05-03 DIAGNOSIS — X500XXA Overexertion from strenuous movement or load, initial encounter: Secondary | ICD-10-CM | POA: Insufficient documentation

## 2013-05-03 DIAGNOSIS — Y939 Activity, unspecified: Secondary | ICD-10-CM | POA: Insufficient documentation

## 2013-05-03 DIAGNOSIS — Z791 Long term (current) use of non-steroidal anti-inflammatories (NSAID): Secondary | ICD-10-CM | POA: Insufficient documentation

## 2013-05-03 DIAGNOSIS — Y929 Unspecified place or not applicable: Secondary | ICD-10-CM | POA: Insufficient documentation

## 2013-05-03 MED ORDER — IBUPROFEN 800 MG PO TABS: 800.0000 mg | ORAL_TABLET | Freq: Three times a day (TID) | ORAL | Status: DC

## 2013-05-03 MED ORDER — IBUPROFEN 400 MG PO TABS
800.0000 mg | ORAL_TABLET | Freq: Once | ORAL | Status: AC
  Administered 2013-05-03: 800 mg via ORAL
  Filled 2013-05-03: qty 2

## 2013-05-03 MED ORDER — TRAMADOL HCL 50 MG PO TABS
50.0000 mg | ORAL_TABLET | Freq: Four times a day (QID) | ORAL | Status: DC | PRN
Start: 2013-05-03 — End: 2015-11-17

## 2013-05-03 NOTE — ED Notes (Signed)
Pt. missed her step and twisted her left ankle this afternoon , presents with pain / swelling at left ankle . Skin intact .

## 2013-05-03 NOTE — Progress Notes (Signed)
Orthopedic Tech Progress Note Patient Details:  Stacey Blake [birth year] [id number]  Ortho Devices Type of Ortho Device: ASO;Crutches Ortho Device/Splint Location: LLE Ortho Device/Splint Interventions: Ordered;Application   Jennye MoccasinHughes, Stacey Blake 05/03/2013, 10:21 PM

## 2013-05-03 NOTE — Progress Notes (Signed)
Orthopedic Tech Progress Note Patient Details:  Si Raideranita Punt 1980-06-14 [id number]  Ortho Devices Type of Ortho Device: ASO;Crutches Ortho Device/Splint Location: LLE Ortho Device/Splint Interventions: Ordered;Application   Jennye MoccasinHughes, Maria Coin Craig 05/03/2013, 10:22 PM

## 2013-05-03 NOTE — ED Provider Notes (Signed)
CSN: [id number]     Arrival date & time 05/03/13  2015 History  This chart was scribed for non-physician practitioner Lowella Dell, PA-C working with Marisa Severin, MD by Valera Castle, ED scribe. This patient was seen in room TR06C/TR06C and the patient's care was started at 10:56 PM.   Chief Complaint  Patient presents with  . Ankle Pain   (Consider location/radiation/quality/duration/timing/severity/associated sxs/prior Treatment) The history is provided by the patient. No language interpreter was used.   HPI Comments: Stacey Blake is a 33 y.o. female who presents to the Emergency Department complaining of constant, 9/10, throbbing, left ankle pain, with associated swelling, onset this afternoon when she missed a step and inverted her ankle. She reports ambulating exacerbates her ankle pain. Denies any numbness or weakness.  Pt is currently wearing ankle brace. She denies any other symptoms.   PCP - Default, Provider, MD  Past Medical History  Diagnosis Date  . Preterm labor    Past Surgical History  Procedure Laterality Date  . Wrist ganglion excision  2007 x2  . Dilation and curettage of uterus    . Cesarean section  01/23/2011    Procedure: CESAREAN SECTION;  Surgeon: Tilda Burrow, MD;  Location: [facility];  Service: Gynecology;  Laterality: N/A;  primary cesarean section of baby girl at 28  APGAR 8/9   No family history on file. History  Substance Use Topics  . Smoking status: Passive Smoke Exposure - Never Smoker -- 12 years    Types: Cigarettes  . Smokeless tobacco: Never Used  . Alcohol Use: No     Comment: Occassional    OB History   Grav Para Term Preterm Abortions TAB SAB Ect Mult Living   6 5 1 4 1  1  1 4      Review of Systems  All other systems reviewed and are negative.   Allergies  Hydrocodone  Home Medications   Current Outpatient Rx  Name  Route  Sig  Dispense  Refill  . acetaminophen (TYLENOL) 500 MG tablet   Oral   Take 1,500 mg by mouth daily  as needed for mild pain.          Marland Kitchen ibuprofen (ADVIL,MOTRIN) 800 MG tablet   Oral   Take 1 tablet (800 mg total) by mouth 3 (three) times daily.   21 tablet   0   . traMADol (ULTRAM) 50 MG tablet   Oral   Take 1 tablet (50 mg total) by mouth every 6 (six) hours as needed.   15 tablet   0    BP 133/82  Pulse 85  Temp(Src) 98.1 F (36.7 C) (Oral)  Resp 16  Wt 146 lb (66.225 kg)  SpO2 100%  LMP 04/09/2013  Physical Exam  Nursing note and vitals reviewed. Constitutional: She is oriented to person, place, and time. She appears well-developed and well-nourished. No distress.  HENT:  Head: Normocephalic and atraumatic.  Eyes: Conjunctivae and EOM are normal.  Neck: Normal range of motion. Neck supple. No tracheal deviation present.  Cardiovascular: Normal rate, regular rhythm and normal heart sounds.   Pulmonary/Chest: Effort normal and breath sounds normal. No respiratory distress.  Musculoskeletal: Normal range of motion. She exhibits edema and tenderness.       Left ankle: She exhibits swelling. She exhibits no deformity. Tenderness. AITFL tenderness found. Achilles tendon normal.  Left ankle: No bony tenderness. Tenderness over ATFL. Moderate ankle swelling. Pain with inversion of left foot.   Neurological: She is  alert and oriented to person, place, and time.  Skin: Skin is warm and dry. She is not diaphoretic.  Psychiatric: She has a normal mood and affect. Her behavior is normal.    ED Course  Procedures (including critical care time)  DIAGNOSTIC STUDIES: Oxygen Saturation is 100% on room air, normal by my interpretation.    COORDINATION OF CARE: 10:59 PM-Discussed treatment plan which includes pain medication and anti-inflammatory with pt at bedside and pt agreed to plan. Advised pt to rest and ice her ankle and to follow up with orthopedist.   Dg Ankle Complete Left  05/03/2013   CLINICAL DATA:  Left ankle pain  EXAM: LEFT ANKLE COMPLETE - 3+ VIEW  COMPARISON:   None.  FINDINGS: There is no evidence of fracture, dislocation, or joint effusion. There is no evidence of arthropathy or other focal bone abnormality. Soft tissues are unremarkable.  IMPRESSION: Negative.   Electronically Signed   By: Alcide CleverMark  Lukens M.D.   On: 05/03/2013 21:06    EKG Interpretation None     Medications  ibuprofen (ADVIL,MOTRIN) tablet 800 mg (800 mg Oral Given 05/03/13 2200)   MDM   Final diagnoses:  Left ankle sprain   Patient in NAD.  Afebrile with normal VS.  Exam and hx consistent with sprained ankle. Distal pulses intact. Good sensation to affected foot. Good capillary refill < 2 sec.  Patient placed in ASO Brace and provided crutches.  Ortho referral provided. Recommend supportive tx with RICE therapy, and pain medications.  Meds given in ED:  Medications  ibuprofen (ADVIL,MOTRIN) tablet 800 mg (800 mg Oral Given 05/03/13 2200)    Discharge Medication List as of 05/03/2013 11:07 PM    START taking these medications   Details  ibuprofen (ADVIL,MOTRIN) 800 MG tablet Take 1 tablet (800 mg total) by mouth 3 (three) times daily., Starting 05/03/2013, Until Discontinued, Print    traMADol (ULTRAM) 50 MG tablet Take 1 tablet (50 mg total) by mouth every 6 (six) hours as needed., Starting 05/03/2013, Until Discontinued, Print       I personally performed the services described in this documentation, which was scribed in my presence. The recorded information has been reviewed and is accurate.     Rudene AndaJacob Gray Vayden Weinand, PA-C 05/10/13 2223

## 2013-05-03 NOTE — ED Notes (Signed)
Patient transported to X-ray and return 

## 2013-05-03 NOTE — Discharge Instructions (Signed)
Make follow up appointment with orthopedics as listed above. Take medications as directed. Do not drive with prescription pain medication. Return to ED if you develop any worsening pain, swelling or numbness of the foot. Rest, ice, and elevate foot. Keep pressure off affected foot. Weight bearing as tolerated.    Ankle Sprain An ankle sprain is an injury to the strong, fibrous tissues (ligaments) that hold the bones of your ankle joint together.  CAUSES An ankle sprain is usually caused by a fall or by twisting your ankle. Ankle sprains most commonly occur when you step on the outer edge of your foot, and your ankle turns inward. People who participate in sports are more prone to these types of injuries.  SYMPTOMS   Pain in your ankle. The pain may be present at rest or only when you are trying to stand or walk.  Swelling.  Bruising. Bruising may develop immediately or within 1 to 2 days after your injury.  Difficulty standing or walking, particularly when turning corners or changing directions. DIAGNOSIS  Your caregiver will ask you details about your injury and perform a physical exam of your ankle to determine if you have an ankle sprain. During the physical exam, your caregiver will press on and apply pressure to specific areas of your foot and ankle. Your caregiver will try to move your ankle in certain ways. An X-ray exam may be done to be sure a bone was not broken or a ligament did not separate from one of the bones in your ankle (avulsion fracture).  TREATMENT  Certain types of braces can help stabilize your ankle. Your caregiver can make a recommendation for this. Your caregiver may recommend the use of medicine for pain. If your sprain is severe, your caregiver may refer you to a surgeon who helps to restore function to parts of your skeletal system (orthopedist) or a physical therapist. HOME CARE INSTRUCTIONS   Apply ice to your injury for 1 2 days or as directed by your caregiver.  Applying ice helps to reduce inflammation and pain.  Put ice in a plastic bag.  Place a towel between your skin and the bag.  Leave the ice on for 15-20 minutes at a time, every 2 hours while you are awake.  Only take over-the-counter or prescription medicines for pain, discomfort, or fever as directed by your caregiver.  Elevate your injured ankle above the level of your heart as much as possible for 2 3 days.  If your caregiver recommends crutches, use them as instructed. Gradually put weight on the affected ankle. Continue to use crutches or a cane until you can walk without feeling pain in your ankle.  If you have a plaster splint, wear the splint as directed by your caregiver. Do not rest it on anything harder than a pillow for the first 24 hours. Do not put weight on it. Do not get it wet. You may take it off to take a shower or bath.  You may have been given an elastic bandage to wear around your ankle to provide support. If the elastic bandage is too tight (you have numbness or tingling in your foot or your foot becomes cold and blue), adjust the bandage to make it comfortable.  If you have an air splint, you may blow more air into it or let air out to make it more comfortable. You may take your splint off at night and before taking a shower or bath. Wiggle your toes in the  splint several times per day to decrease swelling. SEEK MEDICAL CARE IF:   You have rapidly increasing bruising or swelling.  Your toes feel extremely cold or you lose feeling in your foot.  Your pain is not relieved with medicine. SEEK IMMEDIATE MEDICAL CARE IF:  Your toes are numb or blue.  You have severe pain that is increasing. MAKE SURE YOU:   Understand these instructions.  Will watch your condition.  Will get help right away if you are not doing well or get worse. Document Released: 02/08/2005 Document Revised: 11/03/2011 Document Reviewed: 02/20/2011 [facility] Patient Information 2014  [place], [place].

## 2013-05-03 NOTE — Progress Notes (Signed)
Orthopedic Tech Progress Note Patient Details:  Stacey Blake 02/09/1981 [id number]  Ortho Devices Type of Ortho Device: ASO;Crutches Ortho Device/Splint Location: LLE Ortho Device/Splint Interventions: Ordered;Application   Link Burgeson Craig 05/03/2013, 10:22 PM  

## 2013-05-11 NOTE — ED Provider Notes (Signed)
Medical screening examination/treatment/procedure(s) were performed by non-physician practitioner and as supervising physician I was immediately available for consultation/collaboration.   EKG Interpretation None       Ott Zimmerle M Emunah Texidor, MD 05/11/13 0623 

## 2013-12-24 ENCOUNTER — Encounter (HOSPITAL_COMMUNITY): Payer: Self-pay | Admitting: Emergency Medicine

## 2014-11-20 ENCOUNTER — Emergency Department (HOSPITAL_BASED_OUTPATIENT_CLINIC_OR_DEPARTMENT_OTHER)
Admission: EM | Admit: 2014-11-20 | Discharge: 2014-11-20 | Disposition: A | Discharge status: Home / Self Care | Payer: Self-pay | Attending: Emergency Medicine | Admitting: Emergency Medicine

## 2014-11-20 ENCOUNTER — Encounter (HOSPITAL_BASED_OUTPATIENT_CLINIC_OR_DEPARTMENT_OTHER): Payer: Self-pay

## 2014-11-20 DIAGNOSIS — H9201 Otalgia, right ear: Secondary | ICD-10-CM | POA: Insufficient documentation

## 2014-11-20 DIAGNOSIS — Z720 Tobacco use: Secondary | ICD-10-CM | POA: Insufficient documentation

## 2014-11-20 DIAGNOSIS — K0889 Other specified disorders of teeth and supporting structures: Secondary | ICD-10-CM

## 2014-11-20 DIAGNOSIS — K088 Other specified disorders of teeth and supporting structures: Secondary | ICD-10-CM | POA: Insufficient documentation

## 2014-11-20 DIAGNOSIS — R109 Unspecified abdominal pain: Secondary | ICD-10-CM | POA: Insufficient documentation

## 2014-11-20 DIAGNOSIS — Z791 Long term (current) use of non-steroidal anti-inflammatories (NSAID): Secondary | ICD-10-CM | POA: Insufficient documentation

## 2014-11-20 DIAGNOSIS — Z8751 Personal history of pre-term labor: Secondary | ICD-10-CM | POA: Insufficient documentation

## 2014-11-20 DIAGNOSIS — K029 Dental caries, unspecified: Secondary | ICD-10-CM | POA: Insufficient documentation

## 2014-11-20 MED ORDER — AMOXICILLIN 500 MG PO CAPS: 500.0000 mg | ORAL_CAPSULE | Freq: Three times a day (TID) | ORAL | Status: DC

## 2014-11-20 MED ORDER — OXYCODONE-ACETAMINOPHEN 5-325 MG PO TABS: 1.0000 | ORAL_TABLET | ORAL | Status: DC | PRN

## 2014-11-20 MED ORDER — BUPIVACAINE-EPINEPHRINE (PF) 0.5% -1:200000 IJ SOLN
INTRAMUSCULAR | Status: AC
  Administered 2014-11-20: 1.8 mL
  Filled 2014-11-20: qty 1.8

## 2014-11-20 MED ORDER — BUPIVACAINE-EPINEPHRINE (PF) 0.5% -1:200000 IJ SOLN
1.8000 mL | Freq: Once | INTRAMUSCULAR | Status: AC
  Administered 2014-11-20: 1.8 mL

## 2014-11-20 NOTE — ED Provider Notes (Signed)
CSN: [id number]     Arrival date & time 11/20/14  1109 History   First MD Initiated Contact with Patient 11/20/14 1136     Chief Complaint  Patient presents with  . Dental Pain    HPI Comments: Patient states that she's been having dental pain since Friday. States that she has always had poor dentation and does not see a dentist. Pain is mainly located in the top right part of her mouth. She states that nothing has helped her pain. She has tried tramadol, ibuprofen, and Vicodin with no improvement. She is now endorsing associated right ear pain that started yesterday. Denies any fevers. Is unable to eat due to pain.  Patient is a 34 y.o. female presenting with tooth pain.  Dental Pain Location:  Upper Upper teeth location:  6/RU cuspid Quality:  Aching and throbbing Severity:  Severe Onset quality:  Gradual Timing:  Constant Progression:  Worsening Chronicity:  Recurrent Context: dental caries and poor dentition   Relieved by:  Nothing Worsened by:  Cold food/drink and touching Ineffective treatments:  NSAIDs and topical anesthetic gel Associated symptoms: no difficulty swallowing, no fever, no headaches, no neck pain and no neck swelling   Risk factors: lack of dental care, periodontal disease and smoking     Past Medical History  Diagnosis Date  . Preterm labor    Past Surgical History  Procedure Laterality Date  . Wrist ganglion excision  2007 x2  . Dilation and curettage of uterus    . Cesarean section  01/23/2011    Procedure: CESAREAN SECTION;  Surgeon: Tilda Burrow, MD;  Location: [facility];  Service: Gynecology;  Laterality: N/A;  primary cesarean section of baby girl at 66  APGAR 8/9   No family history on file. Social History  Substance Use Topics  . Smoking status: Current Every Day Smoker -- 12 years  . Smokeless tobacco: Never Used  . Alcohol Use: No     Comment: Occassional    OB History    Gravida Para Term Preterm AB TAB SAB Ectopic Multiple Living   Review of Systems  Constitutional: Negative for fever and chills.  HENT: Positive for dental problem and ear pain.   Respiratory: Negative for shortness of breath.   Cardiovascular: Negative for chest pain.  Gastrointestinal: Positive for abdominal pain.  Musculoskeletal: Negative for neck pain.  Neurological: Negative for headaches.  Also per HPI  Allergies  Hydrocodone  Home Medications   Prior to Admission medications   Medication Sig Start Date End Date Taking? Authorizing Provider  amoxicillin (AMOXIL) 500 MG capsule Take 1 capsule (500 mg total) by mouth 3 (three) times daily. 11/20/14   Pincus Large, DO  ibuprofen (ADVIL,MOTRIN) 800 MG tablet Take 1 tablet (800 mg total) by mouth 3 (three) times daily. 05/03/13   Cristobal Goldmann, PA-C  oxyCODONE-acetaminophen (ROXICET) 5-325 MG tablet Take 1-2 tablets by mouth every 4 (four) hours as needed for severe pain. 11/20/14   Pincus Large, DO  traMADol (ULTRAM) 50 MG tablet Take 1 tablet (50 mg total) by mouth every 6 (six) hours as needed. 05/03/13   Cristobal Goldmann, PA-C   BP 121/79 mmHg  Pulse 74  Temp(Src) 98.3 F (36.8 C) (Oral)  Resp 16  Ht  (1.803 m)  Wt 144 lb (65.318 kg)  BMI 20.09 kg/m2  SpO2 100%  LMP 11/09/2014 Physical Exam  Constitutional: She appears  well-developed and well-nourished. She appears distressed.  HENT:  Head: Normocephalic and atraumatic.  Mouth/Throat: Oropharynx is clear and moist and mucous membranes are normal. Abnormal dentition. Dental caries present. No dental abscesses.    Eyes: EOM are normal.  Neck: Normal range of motion. Neck supple.  Cardiovascular: Normal rate, regular rhythm, normal heart sounds and intact distal pulses.   Pulmonary/Chest: Effort normal and breath sounds normal.  Lymphadenopathy:    She has no cervical adenopathy.  Neurological: She is alert.  Skin: Skin is warm and dry.  Psychiatric:  tearful    ED Course  Procedures (including  critical care time) Labs Review Labs Reviewed - No data to display  Imaging Review No results found. I have personally reviewed and evaluated these images and lab results as part of my medical decision-making.   EKG Interpretation None      MDM   Final diagnoses:  Pain, dental   Patient presenting with dental pain in the 6\RU cuspid tooth. Patient has poor dentition diffusely. Has never followed up with a dentist. Dental block given in ED with immediate pain relief. Will give course of amoxicillin and pain medication for patient to use. Resource guide given to patient to find a dentist. Strongly encouraged her to follow-up as the measures used in ED are only temporary. Patient agreeable to plan. Stable for discharge. Return precautions and handout given.   Caryl [place], DO 11/20/2014, 2:52 PM PGY-2, [facility] Family Medicine    Pincus Large, DO 11/20/14 1503  Gwyneth Sprout, MD 11/20/14 1539  Gwyneth Sprout, MD 12/04/14 2009

## 2014-11-20 NOTE — Discharge Instructions (Signed)
Please try and follow-up with a dentist as the medications are only temporary relief Antibiotics given for infection Pain medication also given  Dental Pain Toothache is pain in or around a tooth. It may get worse with chewing or with cold or heat.  HOME CARE  Your dentist may use a numbing medicine during treatment. If so, you may need to avoid eating until the medicine wears off. Ask your dentist about this.  Only take medicine as told by your dentist or doctor.  Avoid chewing food near the painful tooth until after all treatment is done. Ask your dentist about this. GET HELP RIGHT AWAY IF:   The problem gets worse or new problems appear.  You have a fever.  There is redness and puffiness (swelling) of the face, jaw, or neck.  You cannot open your mouth.  There is pain in the jaw.  There is very bad pain that is not helped by medicine. MAKE SURE YOU:   Understand these instructions.  Will watch your condition.  Will get help right away if you are not doing well or get worse. Document Released: 07/28/2007 Document Revised: 05/03/2011 Document Reviewed: 07/28/2007 [facility] Patient Information 2015 [place], [place]. This information is not intended to replace advice given to you by your health care provider. Make sure you discuss any questions you have with your health care provider.   Emergency Department Resource Guide 1) Find a Doctor and Pay Out of Pocket Although you won't have to find out who is covered by your insurance plan, it is a good idea to ask around and get recommendations. You will then need to call the office and see if the doctor you have chosen will accept you as a new patient and what types of options they offer for patients who are self-pay. Some doctors offer discounts or will set up payment plans for their patients who do not have insurance, but you will need to ask so you aren't surprised when you get to your appointment.  2) Contact Your Local Health  Department Not all health departments have doctors that can see patients for sick visits, but many do, so it is worth a call to see if yours does. If you don't know where your local health department is, you can check in your phone book. The CDC also has a tool to help you locate your state's health department, and many state websites also have listings of all of their local health departments.  3) Find a Walk-in Clinic If your illness is not likely to be very severe or complicated, you may want to try a walk in clinic. These are popping up all over the country in pharmacies, drugstores, and shopping centers. They're usually staffed by nurse practitioners or physician assistants that have been trained to treat common illnesses and complaints. They're usually fairly quick and inexpensive. However, if you have serious medical issues or chronic medical problems, these are probably not your best option.  No Primary Care Doctor: - Call Health Connect at  [phone] - they can help you locate a primary care doctor that  accepts your insurance, provides certain services, etc. - Physician Referral Service- [phone]  Chronic Pain Problems: Organization         Address  Phone   Notes  [facility]  [phone] Patients need to be referred by their primary care doctor.   Medication Assistance: Retail buyer  Notes  [facility] Medication [facility] [address] [place] [place], [place] [address] [phone] --Must be a resident of [facility] -- Must have NO insurance coverage whatsoever (no Medicaid/ Medicare, etc.) -- The pt. MUST have a primary care doctor that directs their care regularly and follows them in the community   [facility]ist  [phone]   [employer]  [phone]    Agencies that provide inexpensive medical care: Organization         Address  Phone   Notes  [facility]  [phone]   [facility]    [phone]   [facility] [address] [place], [place] [address] [phone]   [facility] [address] [place] [place] [phone]   Planned Parenthood    [phone]   [facility]    [phone]   [facility]  [address], [place] Phone:  [phone], Fax:  [phone] Hours of Operation:  9 am - 6 pm, M-F.  Also accepts Medicaid/Medicare and self-pay.  [facility]  [address], [place] Phone: [phone], Fax: [phone]. Hours of Operation:  8:30 am - 5:30 pm, M-F.  Also accepts Medicaid and self-pay.  [facility] [place] [address], [place] Phone: [phone]   [facility] [address] Natasha Bence [place], [place] [phone], Ext. [phone] Mondays & Thursdays: 7-9 AM.  First 15 patients are seen on a first come, first serve basis.    Medicaid-accepting [facility] Providers:  Organization         Address  Phone   Notes  [facility] [address],  [phone] Also accepts self-pay patients.  [facility] [address] Laurell Josephs [place], [place]  [phone]   [facility] [address], [place] [phone]   [facility] [address] [place] [phone]   Renaye Rakers [address], [place]   [phone] Only accepts [place] Access [place] patients after they have their name applied to their card.   Self-Pay (no insurance) in [facility]:  Organization         Address  Phone   Notes  Sickle Cell Patients, [facility] Internal Medicine [address] [place], [place] [phone]   [facility] [address] [place], [place] [phone]   [facility] [place]  [address],  [place] [phone]   [facility]/Dr. Osei-Bonsu  [address], [place] or [address], [place] [phone] Phone number for both [place] and [place] locations is the same.  [facility] [address] [place] [phone]   [facility] [address], [place] or [address] [phone]   [facility] [address] [place] [phone], phone; [phone], fax Sees patients 1st and 3rd Saturday of every month.  Must not qualify for public or private insurance (i.e. Medicaid, Medicare, [place] Health Choice, Veterans' Benefits)  Household income should be no more than 200% of the poverty level The clinic cannot treat you if you are pregnant or think you are pregnant  Sexually transmitted  diseases are not treated at the clinic.    Dental Care: Organization         Address  Phone  Notes  [facility] Department of [facility] [address] [place], [place] [phone] Accepts children up to age 78 who are enrolled in [place] or [place] Health Choice; pregnant women with a Medicaid card; and children who have applied for Medicaid or [place] Health Choice, but were declined, whose parents can pay a reduced fee at time of service.  [facility] Department of [facility]  [address], [place] [phone] Accepts children up to age 61 who are enrolled in [place] or [place] Health Choice; pregnant women with a Medicaid card; and children who have applied for Medicaid or [place] Health Choice, but were declined, whose parents can pay a reduced fee at time of service.  [facility]  [address] [place], [place] [phone] Patients are seen by appointment only. Walk-ins are not accepted. [facility] will see patients 69 years of age and older. Monday - Tuesday (8am-5pm) Most Wednesdays  (8:30-5pm) $30 per visit, cash only  [facility]  [address], [facility] [phone] Patients are seen by appointment only. Walk-ins are not accepted. [facility] will see patients 59 years of age and older. One Wednesday Evening (Monthly: Volunteer Based).  $30 per visit, cash only  [employer]  [phone] for adults; Children under age 62, call Graduate Pediatric Dentistry at [phone]. Children aged 7-14, please call [phone] to request a pediatric application.  Dental services are provided in all areas of dental care including fillings, crowns and bridges, complete and partial dentures, implants, gum treatment, root canals, and extractions. Preventive care is also provided. Treatment is provided to both adults and children. Patients are selected via a lottery and there is often a waiting list.   [facility] [address] [place]  [phone] [email or web address]   [facility] [address] [place], [place] [phone], Ext. [phone] Second and Fourth Thursday of each month, opens at 6:30 AM; Clinic ends at 9 AM.  Patients are seen on a first-come first-served basis, and a limited number are seen during each clinic.   [facility]  [address] Ether Griffins [place], [place] [phone]   Eligibility Requirements You must have lived in [place], [place], or [place] counties for at least the last three months.   You cannot be eligible for state or federal sponsored [place], including [employer], [place], or [employer].   You generally cannot be eligible for healthcare insurance through your employer.    How to apply: Eligibility screenings are held every Tuesday and Wednesday afternoon from 1:00 pm until 4:00 pm. You do not need an appointment for the interview!  [facility] [address] [place], [place] [phone]   [facility]  Health Department  [phone]   [facility] Health Department  [phone]   [facility] Health Department  [phone]    Behavioral Health Resources in the Community: Intensive Outpatient Programs Organization         Address  Phone  Notes  [facility] Services [address] [place], [place] [phone]   [facility] Outpatient [address], [place], [place] [phone]   [facility] [address] [place], [place]  [phone]  [facility] [address],  [place], [place] [phone] or [phone]   Substance Abuse Resources Organization         Address  Phone  Notes  [facility]  [phone]   [facility]  [phone]   [facility] [place]  [phone]   Floydene Flock  [phone]   Residential & Outpatient Substance Abuse Program  [phone]   Psychological Services Organization         Address  Phone  Notes  [facility] Behavioral Health  [phone]   [facility] Services  [phone]   [facility] Mental Health [address] [place] [phone] or [phone]    Mobile Crisis Teams Organization         Address  Phone  Notes  [facility], Mobile Crisis Care Unit  [phone]   [facility]  [address] [place], [place] [phone]   Doristine Locks [address] [place] [phone]    Self-Help/Support Groups Organization         Address  Phone             Notes  Mental Health Assoc. of  - variety of support groups  336- I7437963 Call for more information  Narcotics Anonymous (NA), [facility] [address], [employer] [place]  2 meetings at this location   Statistician         Address  Phone  Notes  [facility] [address],    [place]  [phone]   [facility]  [address] [place] [address], [place], [place]  [phone]   [facility] [address] [place], [place] [phone] Admissions: 8am-3pm M-F  [facility] [address]    [place], [place] [phone]   [facility] [address] [place], [place], [place] [phone]   [facility] [address]  [place], [place] [phone]   [facility] - Intensive Outpatient [address] Laurell Josephs [address], [place], [place] [phone]   [facility] ([facility] [address] [place]  [place], [place] [phone] or [phone]   [facility] ([facility]) [address] [place] [address] [place], [place] [phone] Accepts Medicaid  [facility] [address]  [place] [phone] Substance Abuse/Addiction Treatment   [facility] Organization         Address  Phone  Notes  [facility]  [phone]   Angie Fava, PhD [address] Ervin Knack [place], [place]   [phone] or ([phone]   [facility] Behavioral   [address] [place], [place] [phone]   [facility] [address] [place], [place] [phone] Insurance/Medicaid/sponsorship through [facility] and Families [address]                                    [place], [place] [phone] Therapy/tele-psych/case  [facility] [address], [place] [phone]    Dr. Lolly Mustache  [phone]   [facility]  [employer] [facility]. 1) [address] [place] 2) [address], [place] 3)  [address], [place] [phone]  [phone]   [facility] Child Abuse Hotline 6475752791 or 7407605068 (After Hours)

## 2014-11-20 NOTE — ED Notes (Signed)
Pt directed to pharmacy to pick up medications. ED resource guide given for f/u 

## 2014-11-20 NOTE — ED Notes (Addendum)
C/o toothache x 2 x 1 week-states she has been taking tramadol and ibuprofen and a family member's vicodin "even though i'm allergic to it"  w/o relief and now having pain to right side of face and right ear

## 2015-11-03 ENCOUNTER — Other Ambulatory Visit (HOSPITAL_COMMUNITY): Payer: Self-pay | Admitting: *Deleted

## 2015-11-03 DIAGNOSIS — N6310 Unspecified lump in the right breast, unspecified quadrant: Secondary | ICD-10-CM

## 2015-11-07 ENCOUNTER — Ambulatory Visit (HOSPITAL_COMMUNITY): Payer: Self-pay

## 2015-11-07 ENCOUNTER — Other Ambulatory Visit: Payer: Self-pay

## 2015-11-17 ENCOUNTER — Encounter (HOSPITAL_BASED_OUTPATIENT_CLINIC_OR_DEPARTMENT_OTHER): Payer: Self-pay | Admitting: Respiratory Therapy

## 2015-11-17 ENCOUNTER — Emergency Department (HOSPITAL_BASED_OUTPATIENT_CLINIC_OR_DEPARTMENT_OTHER): Payer: Self-pay

## 2015-11-17 DIAGNOSIS — J069 Acute upper respiratory infection, unspecified: Secondary | ICD-10-CM | POA: Insufficient documentation

## 2015-11-17 DIAGNOSIS — F17200 Nicotine dependence, unspecified, uncomplicated: Secondary | ICD-10-CM | POA: Insufficient documentation

## 2015-11-17 NOTE — ED Triage Notes (Signed)
Cough, body aches, nasal congestion x 2 days.

## 2015-11-18 ENCOUNTER — Emergency Department (HOSPITAL_BASED_OUTPATIENT_CLINIC_OR_DEPARTMENT_OTHER)
Admission: EM | Admit: 2015-11-18 | Discharge: 2015-11-18 | Discharge status: Against Medical Advice | Payer: Self-pay | Attending: Emergency Medicine | Admitting: Emergency Medicine

## 2015-11-18 DIAGNOSIS — J069 Acute upper respiratory infection, unspecified: Secondary | ICD-10-CM

## 2015-11-18 MED ORDER — ALBUTEROL SULFATE HFA 108 (90 BASE) MCG/ACT IN AERS: 2.0000 | INHALATION_SPRAY | Freq: Once | RESPIRATORY_TRACT | Status: DC

## 2015-11-18 MED ORDER — KETOROLAC TROMETHAMINE 30 MG/ML IJ SOLN
30.0000 mg | Freq: Once | INTRAMUSCULAR | Status: DC
  Filled 2015-11-18: qty 1

## 2015-11-18 NOTE — ED Notes (Signed)
Pt is back in waiting area states was asleep and didn't here her name called

## 2015-11-18 NOTE — ED Notes (Addendum)
Pt not in room when rn went in

## 2015-11-18 NOTE — ED Provider Notes (Addendum)
[facility]   CSN: [id number] Arrival date & time: 11/17/15  2221     History   Chief Complaint Chief Complaint  Patient presents with  . URI    HPI Stacey Blake is a 35 y.o. female.  HPI  This is a 35 year old female who presents with congestion, cough, chest pain and shortness of breath. Onset within the last 24 hours. Reports chills without documented fevers. No known sick contacts. Denies nausea, vomiting, or diarrhea. She has not taken anything for her symptoms. States chest pain is worse with coughing. Reports diffuse myalgias. Current pain is 5 out of 10.   Patient expresses frustration at her wait time. She was called multiple times but did not answer. Patient states that she's been in the lobby to time. I have apologized.  Past Medical History:  Diagnosis Date  . Preterm labor     Patient Active Problem List   Diagnosis Date Noted  . Supervision of pregnancy with grand multiparity in third trimester 01/23/2011  . Insufficient prenatal care 01/23/2011  . Toothache 01/23/2011  . Smoker 01/23/2011  . Sterilization consult 01/23/2011    Past Surgical History:  Procedure Laterality Date  . CESAREAN SECTION  01/23/2011   Procedure: CESAREAN SECTION;  Surgeon: Tilda BurrowJohn V Ferguson, MD;  Location: [facility];  Service: Gynecology;  Laterality: N/A;  primary cesarean section of baby girl at [id number]  APGAR 8/9  . DILATION AND CURETTAGE OF UTERUS    . WRIST GANGLION EXCISION  2007 x2    OB History    Gravida Para Term Preterm AB Living   6 5 1 4 1 4    SAB TAB Ectopic Multiple Live Births   1     1 4        Home Medications    Prior to Admission medications   Not on File    Family History History reviewed. No pertinent family history.  Social History Social History  Substance Use Topics  . Smoking status: Current Every Day Smoker    Years: 12.00  . Smokeless tobacco: Never Used  . Alcohol use No     Comment: Occassional       Allergies   Hydrocodone   Review of Systems Review of Systems  Constitutional: Positive for chills. Negative for fever.  HENT: Positive for congestion. Negative for sore throat.   Respiratory: Positive for cough and shortness of breath.   Cardiovascular: Positive for chest pain.  Gastrointestinal: Negative for abdominal pain, diarrhea, nausea and vomiting.  All other systems reviewed and are negative.    Physical Exam Updated Vital Signs BP 108/80 (BP Location: Left Arm)   Pulse 72   Temp 99.1 F (37.3 C) (Oral)   Resp 18   Ht 5\' 11"  (1.803 m)   Wt 150 lb (68 kg)   LMP  (LMP Unknown)   SpO2 100%   BMI 20.92 kg/m   Physical Exam  Constitutional: She is oriented to person, place, and time. She appears well-developed and well-nourished. No distress.  HENT:  Head: Normocephalic and atraumatic.  Eyes: Pupils are equal, round, and reactive to light.  Cardiovascular: Normal rate, regular rhythm and normal heart sounds.   No murmur heard. Pulmonary/Chest: Effort normal. No respiratory distress. She has wheezes.  Abdominal: Soft. Bowel sounds are normal. There is no tenderness. There is no guarding.  Neurological: She is alert and oriented to person, place, and time.  Skin: Skin is warm and dry.  Psychiatric: She has a  normal mood and affect.  Nursing note and vitals reviewed.    ED Treatments / Results  Labs (all labs ordered are listed, but only abnormal results are displayed) Labs Reviewed - No data to display  EKG  EKG Interpretation None       Radiology Dg Chest 2 View  Result Date: 11/17/2015 CLINICAL DATA:  Productive cough, central chest pain and body aches for 2 days. EXAM: CHEST  2 VIEW COMPARISON:  None. FINDINGS: The heart size and mediastinal contours are within normal limits. Mildly elevated LEFT hemidiaphragm. Both lungs are clear. The visualized skeletal structures are unremarkable. Multiple loops of gas filled bowel in LEFT abdomen.  IMPRESSION: No acute cardiopulmonary process. Electronically Signed   By: Awilda Metro M.D.   On: 11/17/2015 23:52    Procedures Procedures (including critical care time)  Medications Ordered in ED Medications  ketorolac (TORADOL) 30 MG/ML injection 30 mg (not administered)  albuterol (PROVENTIL HFA;VENTOLIN HFA) 108 (90 Base) MCG/ACT inhaler 2 puff (not administered)     Initial Impression / Assessment and Plan / ED Course  I have reviewed the triage vital signs and the nursing notes.  Pertinent labs & imaging results that were available during my care of the patient were reviewed by me and considered in my medical decision making (see chart for details).  Clinical Course    Patient presents with upper respiratory symptoms. Nontoxic. Afebrile. Vital signs reassuring. Scant wheezing on exam but otherwise exam is reassuring. X-ray negative for pneumonia. Suspect viral etiology. Discussed with patient symptom control including NSAIDs and an inhaler given that she is a smoker.  Nursing attempted to medicate patient. However, patient was no longer in the room. She appeared to have left without treatment.  Final Clinical Impressions(s) / ED Diagnoses   Final diagnoses:  URI (upper respiratory infection)    New Prescriptions There are no discharge medications for this patient.    Shon Baton, MD 11/18/15 1610    Shon Baton, MD 11/18/15 [phone]

## 2015-11-18 NOTE — ED Notes (Signed)
Called x 2 with no answer and no show to triage or registration.

## 2015-11-27 ENCOUNTER — Encounter (HOSPITAL_COMMUNITY): Payer: Self-pay | Admitting: *Deleted

## 2015-11-27 ENCOUNTER — Ambulatory Visit (HOSPITAL_COMMUNITY)
Admission: RE | Admit: 2015-11-27 | Discharge: 2015-11-27 | Disposition: A | Discharge status: Home / Self Care | Payer: Self-pay | Source: Ambulatory Visit | Attending: Obstetrics and Gynecology | Admitting: Obstetrics and Gynecology

## 2015-11-27 ENCOUNTER — Ambulatory Visit
Admission: RE | Admit: 2015-11-27 | Discharge: 2015-11-27 | Disposition: A | Discharge status: Home / Self Care | Payer: No Typology Code available for payment source | Source: Ambulatory Visit | Attending: Obstetrics and Gynecology | Admitting: Obstetrics and Gynecology

## 2015-11-27 VITALS — BP 102/64 | Temp 98.6°F | Ht 71.0 in | Wt 147.8 lb

## 2015-11-27 DIAGNOSIS — Z1239 Encounter for other screening for malignant neoplasm of breast: Secondary | ICD-10-CM

## 2015-11-27 DIAGNOSIS — N6314 Unspecified lump in the right breast, lower inner quadrant: Secondary | ICD-10-CM

## 2015-11-27 DIAGNOSIS — N6310 Unspecified lump in the right breast, unspecified quadrant: Secondary | ICD-10-CM

## 2015-11-27 HISTORY — DX: Anemia, unspecified: D64.9

## 2015-11-27 NOTE — Patient Instructions (Signed)
Explained breast self awareness to Stacey Blake. Patient did not need a Pap smear today due to last Pap smear was 09/14/2015. Let her know BCCCP will cover Pap smears every 3 years unless has a history of abnormal Pap smears. [facility] for [facility] and possible right breast ultrasound. Appointment scheduled for Thursday, November 27, 2015 at 1110. Discussed smoking cessation. Referred patient to the [facility] Quit[facility] and gave resources to free smoking cessation classes offered at [facility]. Layana Asmus verbalized understanding.  Brieanne Mignone, Kathaleen Maserhristine Poll, RN 12:26 PM

## 2015-11-27 NOTE — Progress Notes (Signed)
Complaints of right breast lump x 3-4 months that is tender. Patient states she has sharp pains at times and has a constant dull nagging pain. Patient rates the sharp pain at a 7 and the constant pain at a 4 out of 10. Patient stated she has noticed a spontaneous brownish discharge from both breast around 3 months ago. Patient complained of a black spot on her breast that drains a discharge occasionally that has a foul odor.  Pap Smear:  Pap smear not completed today. [facility] and normal. Per patient has a history of an abnormal Pap smear in the early 2000's that a colposcopy was completed for follow up. Per patient all Pap smears have been normal since the colposcopy was completed. Last Pap smear result is in EPIC.   Physical exam: Breasts Breasts symmetrical. No skin abnormalities left breast. Observed a blackhead on patients right breast at 12 o'clock 6 cm from the nipple that patient states occasionally has a foul smelling discharge. No nipple retraction bilateral breasts. No nipple discharge bilateral breasts. Unable to express any breast discharge on exam. No lymphadenopathy. No lumps palpated left breast. Palpated a mobile lump within the right breast at 5 o'clock 1 cm from the nipple. No complaints of pain or tenderness on exam. [facility] for diagnostic mammogram and possible right breast ultrasound. Appointment scheduled for Thursday, November 27, 2015 at 1110.        Pelvic/Bimanual No Pap smear completed today since last Pap smear was 09/14/2015. Pap smear not indicated per BCCCP guidelines.   Smoking History: Patient is a current smoker. Discussed smoking cessation. Referred patient to the [facility] Quitline and gave resources to free smoking cessation classes offered at [facility].  Patient Navigation: Patient education provided. Access to services provided for patient through [facility] program.

## 2015-12-01 ENCOUNTER — Encounter (HOSPITAL_COMMUNITY): Payer: Self-pay | Admitting: *Deleted

## 2016-04-05 ENCOUNTER — Emergency Department (HOSPITAL_BASED_OUTPATIENT_CLINIC_OR_DEPARTMENT_OTHER)
Admission: EM | Admit: 2016-04-05 | Discharge: 2016-04-05 | Disposition: A | Discharge status: Home / Self Care | Payer: No Typology Code available for payment source | Attending: Emergency Medicine | Admitting: Emergency Medicine

## 2016-04-05 ENCOUNTER — Encounter (HOSPITAL_BASED_OUTPATIENT_CLINIC_OR_DEPARTMENT_OTHER): Payer: Self-pay | Admitting: *Deleted

## 2016-04-05 DIAGNOSIS — M659 Synovitis and tenosynovitis, unspecified: Secondary | ICD-10-CM

## 2016-04-05 DIAGNOSIS — F17200 Nicotine dependence, unspecified, uncomplicated: Secondary | ICD-10-CM | POA: Insufficient documentation

## 2016-04-05 DIAGNOSIS — M6588 Other synovitis and tenosynovitis, other site: Secondary | ICD-10-CM | POA: Insufficient documentation

## 2016-04-05 MED ORDER — ACETAMINOPHEN 500 MG PO TABS
1000.0000 mg | ORAL_TABLET | Freq: Once | ORAL | Status: AC
  Administered 2016-04-05: 1000 mg via ORAL
  Filled 2016-04-05: qty 2

## 2016-04-05 MED ORDER — IBUPROFEN 800 MG PO TABS
800.0000 mg | ORAL_TABLET | Freq: Once | ORAL | Status: AC
  Administered 2016-04-05: 800 mg via ORAL
  Filled 2016-04-05: qty 1

## 2016-04-05 NOTE — ED Triage Notes (Signed)
Pain in her arms and hands x 2 weeks. She bowls every week and feels she could have pulled something per pt. Left hand has gradually increase in pain and numbness x 3 days.

## 2016-04-05 NOTE — ED Provider Notes (Addendum)
[facility] Provider Note   CSN: [id number] Arrival date & time: 04/05/16  1219     History   Chief Complaint Chief Complaint  Patient presents with  . Arm Pain    HPI Stacey Blake is a 36 y.o. female.  36 yo F with a chief complaint of bilateral hand pain. This been going on for the past few days. Patient feels that the pain is getting worse. 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 Having trouble gripping the boxes to move it.   The history is provided by the patient.  Arm Pain  This is a new problem. The current episode started more than 2 days ago. The problem occurs constantly. The problem has not changed since onset.Pertinent negatives include no chest pain, no headaches and no shortness of breath. Nothing aggravates the symptoms. Nothing relieves the symptoms. She has tried nothing for the symptoms. The treatment provided no relief.    Past Medical History:  Diagnosis Date  . Anemia   . Preterm labor     Patient Active Problem List   Diagnosis Date Noted  . Supervision of pregnancy with grand multiparity in third trimester 01/23/2011  . Insufficient prenatal care 01/23/2011  . Toothache 01/23/2011  . Smoker 01/23/2011  . Sterilization consult 01/23/2011    Past Surgical History:  Procedure Laterality Date  . CESAREAN SECTION  01/23/2011   Procedure: CESAREAN SECTION;  Surgeon: Tilda BurrowJohn V Ferguson, MD;  Location: [facility];  Service: Gynecology;  Laterality: N/A;  primary cesarean section of baby girl at [id number]  APGAR 8/9  . DILATION AND CURETTAGE OF UTERUS    . WRIST GANGLION EXCISION  2007 x2    OB History    Gravida Para Term Preterm AB Living   6 5 1 4 1 4    SAB TAB Ectopic Multiple Live Births   1     1 4        Home Medications    Prior to Admission medications   Not on File    Family History Family History  Problem Relation Age of Onset  . Heart failure Maternal Grandmother   .  Cancer Maternal Grandmother     throat  . Hypertension Maternal Grandfather   . Heart failure Maternal Grandfather   . Cancer Maternal Grandfather     prostate  . Hypertension Paternal Grandmother   . Heart failure Paternal Grandmother   . Heart failure Paternal Grandfather     Social History Social History  Substance Use Topics  . Smoking status: Current Every Day Smoker    Years: 12.00  . Smokeless tobacco: Never Used  . Alcohol use No     Comment: Occassional      Allergies   Hydrocodone   Review of Systems Review of Systems  Constitutional: Negative for chills and fever.  HENT: Negative for congestion and rhinorrhea.   Eyes: Negative for redness and visual disturbance.  Respiratory: Negative for shortness of breath and wheezing.   Cardiovascular: Negative for chest pain and palpitations.  Gastrointestinal: Negative for nausea and vomiting.  Genitourinary: Negative for dysuria and urgency.  Musculoskeletal: Positive for arthralgias and myalgias.  Skin: Negative for pallor and wound.  Neurological: Negative for dizziness and headaches.     Physical Exam Updated Vital Signs BP 119/82 (BP Location: Right Arm)   Pulse 68   Temp 98.5 F (36.9 C) (Oral)   Resp 24   Ht 5\' 11"  (  1.803 m)   Wt 149 lb 4.8 oz (67.7 kg)   LMP 04/04/2016   SpO2 100%   BMI 20.82 kg/m   Physical Exam  Constitutional: She is oriented to person, place, and time. She appears well-developed and well-nourished. No distress.  HENT:  Head: Normocephalic and atraumatic.  Eyes: EOM are normal. Pupils are equal, round, and reactive to light.  Neck: Normal range of motion. Neck supple.  Cardiovascular: Normal rate and regular rhythm.  Exam reveals no gallop and no friction rub.   No murmur heard. Pulmonary/Chest: Effort normal. She has no wheezes. She has no rales.  Abdominal: Soft. She exhibits no distension. There is no tenderness.  Musculoskeletal: She exhibits no edema or tenderness.    Pain is elicited bilaterally with the [facility] test. Pulse motor and sensation is intact distally.  Neurological: She is alert and oriented to person, place, and time.  Skin: Skin is warm and dry. She is not diaphoretic.  Psychiatric: She has a normal mood and affect. Her behavior is normal.  Nursing note and vitals reviewed.    ED Treatments / Results  Labs (all labs ordered are listed, but only abnormal results are displayed) Labs Reviewed - No data to display  EKG  EKG Interpretation None       Radiology No results found.  Procedures Procedures (including critical care time)  Medications Ordered in ED Medications  acetaminophen (TYLENOL) tablet 1,000 mg (1,000 mg Oral Given 04/05/16 1503)  ibuprofen (ADVIL,MOTRIN) tablet 800 mg (800 mg Oral Given 04/05/16 1503)     Initial Impression / Assessment and Plan / ED Course  I have reviewed the triage vital signs and the nursing notes.  Pertinent labs & imaging results that were available during my care of the patient were reviewed by me and considered in my medical decision making (see chart for details).     36 yo F With a likely overuse injury to bilateral hands. I suspect this is de Quervain's tenosynovitis. We'll place in bilateral wrist splints. PCP follow-up in a week. Limited duty at work.   3:40 PM:  I have discussed the diagnosis/risks/treatment options with the patient and family and believe the pt to be eligible for discharge home to follow-up with PCP. We also discussed returning to the ED immediately if new or worsening sx occur. We discussed the sx which are most concerning (e.g., sudden worsening pain, fever, inability to tolerate by mouth) that necessitate immediate return. Medications administered to the patient during their visit and any new prescriptions provided to the patient are listed below.  Medications given during this visit Medications  acetaminophen (TYLENOL) tablet 1,000 mg (1,000 mg Oral  Given 04/05/16 1503)  ibuprofen (ADVIL,MOTRIN) tablet 800 mg (800 mg Oral Given 04/05/16 1503)     The patient appears reasonably screen and/or stabilized for discharge and I doubt any other medical condition or other [facility] requiring further screening, evaluation, or treatment in the ED at this time prior to discharge.    Final Clinical Impressions(s) / ED Diagnoses   Final diagnoses:  Flexor tenosynovitis of thumb    New Prescriptions There are no discharge medications for this patient.    Melene Plan, DO 04/05/16 1540    Melene Plan, DO 04/05/16 1541

## 2016-04-05 NOTE — ED Notes (Signed)
ED Provider at bedside. 

## 2016-04-05 NOTE — Discharge Instructions (Signed)
Take 4 over the counter ibuprofen tablets 3 times a day or 2 over-the-counter naproxen tablets twice a day for pain. Also take tylenol 1000mg(2 extra strength) four times a day.    

## 2016-04-14 ENCOUNTER — Ambulatory Visit: Payer: No Typology Code available for payment source | Admitting: Medical

## 2016-04-14 ENCOUNTER — Telehealth: Payer: Self-pay

## 2016-04-14 NOTE — Telephone Encounter (Signed)
Patient lvm cancelling new patient appointment due to financial reasons and patient [facility] for 04/21/16, charge or no charge

## 2016-04-21 ENCOUNTER — Ambulatory Visit (HOSPITAL_BASED_OUTPATIENT_CLINIC_OR_DEPARTMENT_OTHER)
Admission: RE | Admit: 2016-04-21 | Discharge: 2016-04-21 | Disposition: A | Discharge status: Home / Self Care | Payer: Self-pay | Source: Ambulatory Visit | Attending: Medical | Admitting: Medical

## 2016-04-21 ENCOUNTER — Encounter: Payer: Self-pay | Admitting: Medical

## 2016-04-21 ENCOUNTER — Ambulatory Visit (INDEPENDENT_AMBULATORY_CARE_PROVIDER_SITE_OTHER): Payer: Self-pay | Admitting: Medical

## 2016-04-21 VITALS — BP 109/54 | HR 81 | Temp 97.8°F | Resp 16 | Ht 71.0 in | Wt 154.4 lb

## 2016-04-21 DIAGNOSIS — M25532 Pain in left wrist: Secondary | ICD-10-CM | POA: Insufficient documentation

## 2016-04-21 DIAGNOSIS — M79609 Pain in unspecified limb: Secondary | ICD-10-CM

## 2016-04-21 DIAGNOSIS — M79604 Pain in right leg: Secondary | ICD-10-CM | POA: Insufficient documentation

## 2016-04-21 DIAGNOSIS — M79642 Pain in left hand: Secondary | ICD-10-CM

## 2016-04-21 MED ORDER — MELOXICAM 7.5 MG PO TABS: ORAL_TABLET | ORAL | 0 refills | Status: DC

## 2016-04-21 NOTE — Patient Instructions (Signed)
For your wrist and hand pain. Stop current otc nsaids and perocet. Use mobic.  Continue the wrist brace.  Get xrays of hand and wrist today.  Also need to schedule the US of lower rt lower ext.  Note written that work return to duty would depend on orthopedist evaluation and opinion.  Follow up in 2 weeks or as needed

## 2016-04-21 NOTE — Progress Notes (Signed)
Pre visit review using our clinic review tool, if applicable. No additional management support is needed unless otherwise documented below in the visit note/SLS  

## 2016-04-21 NOTE — Progress Notes (Signed)
Subjective:    Patient ID: Stacey Blake, female    DOB: 06/17/1980, 36 y.o.   MRN: [id number]  HPI   Pt first time here today. Pt state no prior pcp.  Pt works [employer], pt walks a lot 2-3 miles a day when she can, pt eats healthy, drinks 4-5 cups of coffee a day. Single- 4 children. 5, 6, 9 and 43 yo.  Pt states she had some pain in both hands over 6 weeks. Pain ranges from hand up her forearms but more on left side. Pt states at night wakes up and her hands feel numb and tingle. In February mid month seen in ED and they though she had de Quervains Tenosynovitis.   Since ED visit rt hand pain resolved but lt hand/wrist pain persists. Pain level with use of left hand pain is 7-8/10 with routine mild house cleaning.  Pt states she does not have insurance.(pt not sure if she will have insurance through her work. Pt in ED told her she needed light duty. But her work does not have light duty.   Pt has ganglion cyst surgeries in the  Past.   Pt also describes over past 2 months she has pain in back of her rt leg. She points to popliteal fossa area. Pt has no calf swelling and no sob.   Pt states hospital advised her to take tylenol and ibuprofen for pain. Pt sister gave her some percocet for pain.  LMP- Apr 04, 2016.      Review of Systems  Constitutional: Negative for chills, fatigue and fever.  Respiratory: Negative for cough, chest tightness and shortness of breath.   Cardiovascular: Negative for chest pain and palpitations.  Gastrointestinal: Negative for abdominal pain.  Musculoskeletal:       See hpi.  Skin: Negative for rash.  Neurological: Negative for dizziness, speech difficulty, weakness and headaches.  Hematological: Negative for adenopathy. Does not bruise/bleed easily.  Psychiatric/Behavioral: Negative for behavioral problems, confusion and suicidal ideas. The patient is not nervous/anxious.    Past Medical History:  Diagnosis Date  . Anemia   . Preterm  labor      Social History   Social History  . Marital status: Single    Spouse name: N/A  . Number of children: N/A  . Years of education: N/A   Occupational History  . Not on file.   Social History Main Topics  . Smoking status: Current Every Day Smoker    Years: 12.00  . Smokeless tobacco: Never Used  . Alcohol use No     Comment: Occassional   . Drug use: No  . Sexual activity: Not Currently     Comment: wants to discuss birth control   Other Topics Concern  . Not on file   Social History Narrative  . No narrative on file    Past Surgical History:  Procedure Laterality Date  . CESAREAN SECTION  01/23/2011   Procedure: CESAREAN SECTION;  Surgeon: Tilda Burrow, MD;  Location: [facility];  Service: Gynecology;  Laterality: N/A;  primary cesarean section of baby girl at 66  APGAR 8/9  . DILATION AND CURETTAGE OF UTERUS    . WRIST GANGLION EXCISION  2007 x2    Family History  Problem Relation Age of Onset  . Heart failure Maternal Grandmother   . Cancer Maternal Grandmother     throat  . Hypertension Maternal Grandfather   . Heart failure Maternal Grandfather   . Cancer Maternal Grandfather  prostate  . Hypertension Paternal Grandmother   . Heart failure Paternal Grandmother   . Heart failure Paternal Grandfather     Allergies  Allergen Reactions  . Hydrocodone Rash    No current outpatient prescriptions on file prior to visit.   No current facility-administered medications on file prior to visit.     BP (!) 109/54 (BP Location: Left Arm, Patient Position: Sitting, Cuff Size: Large)   Pulse 81   Temp 97.8 F (36.6 C) (Oral)   Resp 16   Ht 5\' 11"  (1.803 m)   Wt 154 lb 6 oz (70 kg)   LMP 04/04/2016   SpO2 100%   BMI 21.53 kg/m       Objective:   Physical Exam  General- No acute distress. Pleasant patient. Neck- Full range of motion, no jvd Lungs- Clear, even and unlabored. Heart- regular rate and rhythm. Neurologic- CNII- XII grossly  intact.   rt wrist and hand- good rom. No tenderness.   Lt wrist and hand- very tender with minimal range of motion and palpation. On attempted phalen sign testing reports pain.Not swollen or warm  Rt lower ext- mild + homans sign. Rt calf is not swollen.       Assessment & Plan:  For your wrist and hand pain. Stop current otc nsaids and perocet. Use mobic.  Continue the wrist brace.  Get xrays of hand and wrist today.  Also need to schedule the US of lower rt lower ext.  Note written that work return to duty would depend on orthopedist evaluation and opinion.  Follow up in 2 weeks or as needed  [address], Stacey Blake, [employer]C

## 2016-05-26 ENCOUNTER — Encounter: Payer: Self-pay | Admitting: Medical

## 2016-11-16 ENCOUNTER — Ambulatory Visit (INDEPENDENT_AMBULATORY_CARE_PROVIDER_SITE_OTHER): Payer: Self-pay | Admitting: Medical

## 2016-11-16 ENCOUNTER — Other Ambulatory Visit (HOSPITAL_COMMUNITY)
Admission: RE | Admit: 2016-11-16 | Discharge: 2016-11-16 | Disposition: A | Discharge status: Home / Self Care | Payer: Self-pay | Source: Ambulatory Visit | Attending: Medical | Admitting: Medical

## 2016-11-16 VITALS — BP 117/68 | HR 61 | Temp 97.9°F | Resp 16 | Ht 71.0 in | Wt 143.8 lb

## 2016-11-16 DIAGNOSIS — N760 Acute vaginitis: Secondary | ICD-10-CM | POA: Insufficient documentation

## 2016-11-16 DIAGNOSIS — N898 Other specified noninflammatory disorders of vagina: Secondary | ICD-10-CM

## 2016-11-16 DIAGNOSIS — B9689 Other specified bacterial agents as the cause of diseases classified elsewhere: Secondary | ICD-10-CM | POA: Insufficient documentation

## 2016-11-16 MED ORDER — METRONIDAZOLE 500 MG PO TABS: 500.0000 mg | ORAL_TABLET | Freq: Two times a day (BID) | ORAL | 0 refills | Status: DC

## 2016-11-16 NOTE — Patient Instructions (Addendum)
For your vaginal discharge and recent sexual activity, we will send out urine ancillary studies. We will check for gonorrhea, chlamydia, trichomonas and bacterial vaginosis.  We'll go ahead and prescribed Flagyl as you do report history of BV in the past.  We will follow all the studies. If white thick discharge with itchiness becomes predominant feature and all your studies are negative then will prescribe Diflucan.  Follow-up in 10-14 days or as needed.

## 2016-11-16 NOTE — Progress Notes (Signed)
Subjective:    Patient ID: Stacey Blake, female    DOB: 1981-01-06, 36 y.o.   MRN: [id number]  HPI  Pt in for some vaginal discharge. She states just noticed it for 2-3 weeks. She states dc little whitish creamy dc Blood on the watery side.. It does itch faintly. Pt thought some mild odor about one week ago or so.   No fever, no chills or sweats. No adnexal pain. No partner with known std.  Pt is not sexually acitive on regular basis. But 6 weeks ago was active. Condom broke.  Normal cycle 2 weeks ago.    Review of Systems  Constitutional: Negative for chills, fatigue and fever.  Respiratory: Negative for cough, chest tightness, shortness of breath and wheezing.   Cardiovascular: Negative for chest pain and palpitations.  Genitourinary: Positive for vaginal discharge. Negative for difficulty urinating, dysuria, flank pain, frequency, genital sores, pelvic pain, urgency, vaginal bleeding and vaginal pain.  Musculoskeletal: Negative for back pain.  Skin: Negative for rash.  Neurological: Negative for dizziness, seizures, syncope, speech difficulty, weakness and light-headedness.  Hematological: Negative for adenopathy. Does not bruise/bleed easily.  Psychiatric/Behavioral: Negative for behavioral problems and decreased concentration. The patient is not nervous/anxious.    Past Medical History:  Diagnosis Date  . Anemia   . Preterm labor      Social History   Social History  . Marital status: Single    Spouse name: N/A  . Number of children: N/A  . Years of education: N/A   Occupational History  . Not on file.   Social History Main Topics  . Smoking status: Current Every Day Smoker    Years: 12.00  . Smokeless tobacco: Never Used  . Alcohol use No     Comment: Occassional   . Drug use: No  . Sexual activity: Not Currently     Comment: wants to discuss birth control   Other Topics Concern  . Not on file   Social History Narrative  . No narrative on file     Past Surgical History:  Procedure Laterality Date  . CESAREAN SECTION  01/23/2011   Procedure: CESAREAN SECTION;  Surgeon: Tilda Burrow, MD;  Location: [facility];  Service: Gynecology;  Laterality: N/A;  primary cesarean section of baby girl at 52  APGAR 8/9  . DILATION AND CURETTAGE OF UTERUS    . WRIST GANGLION EXCISION  2007 x2    Family History  Problem Relation Age of Onset  . Heart failure Maternal Grandmother   . Cancer Maternal Grandmother        throat  . Hypertension Maternal Grandfather   . Heart failure Maternal Grandfather   . Cancer Maternal Grandfather        prostate  . Hypertension Paternal Grandmother   . Heart failure Paternal Grandmother   . Heart failure Paternal Grandfather     Allergies  Allergen Reactions  . Hydrocodone Rash    No current outpatient prescriptions on file prior to visit.   No current facility-administered medications on file prior to visit.     BP 117/68   Pulse 61   Temp 97.9 F (36.6 C) (Oral)   Resp 16   Ht  (1.803 m)   Wt 143 lb 12.8 oz (65.2 kg)   LMP 11/03/2016   SpO2 100%   BMI 20.06 kg/m       Objective:   Physical Exam  General Appearance- Not in acute distress.  HEENT Eyes- Scleraeral/Conjuntiva-bilat- Not  Yellow. Mouth & Throat- Normal.  Chest and Lung Exam Auscultation: Breath sounds:-Normal. Adventitious sounds:- No Adventitious sounds.  Cardiovascular Auscultation:Rythm - Regular. Heart Sounds -Normal heart sounds.  Abdomen Inspection:-Inspection Normal.  Palpation/Perucssion: Palpation and Percussion of the abdomen reveal- Non Tender(no adnexal region tenderness), No Rebound tenderness, No rigidity(Guarding) and No Palpable abdominal masses.  Liver:-Normal.  Spleen:- Normal.   Back- no cva tenderness.      Assessment & Plan:  For your vaginal discharge and recent sexual activity, we will send out urine ancillary studies. We will check for gonorrhea, chlamydia, trichomonas and  bacterial vaginosis.  We'll go ahead and prescribed Flagyl as you do report history of BV in the past.  We will follow all the studies. If white thick discharge with itchiness becomes predominant feature and all your studies are negative then will prescribe Diflucan.  Follow-up in 10-14 days or as needed.  Mannie Ohlin, Ramon Dredge, PA-C

## 2016-11-17 ENCOUNTER — Telehealth: Payer: Self-pay | Admitting: Medical

## 2016-11-17 LAB — URINE CYTOLOGY ANCILLARY ONLY
Chlamydia: NEGATIVE
Neisseria Gonorrhea: NEGATIVE
TRICH (WINDOWPATH): NEGATIVE

## 2016-11-17 MED ORDER — FLUCONAZOLE 150 MG PO TABS: 150.0000 mg | ORAL_TABLET | Freq: Once | ORAL | 0 refills | Status: AC

## 2016-11-17 NOTE — Telephone Encounter (Signed)
Rx Diflucan sent to patient's pharmacy. 

## 2016-11-19 LAB — URINE CYTOLOGY ANCILLARY ONLY

## 2017-03-11 ENCOUNTER — Encounter (HOSPITAL_BASED_OUTPATIENT_CLINIC_OR_DEPARTMENT_OTHER): Payer: Self-pay | Admitting: Emergency Medicine

## 2017-03-11 ENCOUNTER — Other Ambulatory Visit: Payer: Self-pay

## 2017-03-11 ENCOUNTER — Emergency Department (HOSPITAL_BASED_OUTPATIENT_CLINIC_OR_DEPARTMENT_OTHER)
Admission: EM | Admit: 2017-03-11 | Discharge: 2017-03-11 | Disposition: A | Discharge status: Home / Self Care | Payer: Self-pay | Attending: Emergency Medicine | Admitting: Emergency Medicine

## 2017-03-11 DIAGNOSIS — M654 Radial styloid tenosynovitis [de Quervain]: Secondary | ICD-10-CM | POA: Insufficient documentation

## 2017-03-11 DIAGNOSIS — F17200 Nicotine dependence, unspecified, uncomplicated: Secondary | ICD-10-CM | POA: Insufficient documentation

## 2017-03-11 MED ORDER — NAPROXEN 250 MG PO TABS
500.0000 mg | ORAL_TABLET | Freq: Once | ORAL | Status: AC
  Administered 2017-03-11: 500 mg via ORAL
  Filled 2017-03-11: qty 2

## 2017-03-11 MED ORDER — DICLOFENAC SODIUM ER 100 MG PO TB24: 100.0000 mg | ORAL_TABLET | Freq: Every day | ORAL | 0 refills | Status: DC

## 2017-03-11 NOTE — ED Triage Notes (Signed)
Pt c/o left wrist pain with out known injury. Pt was seen previously for same and dx with sprain and pain has not improved.

## 2017-03-11 NOTE — ED Notes (Signed)
Pt verbalizes understanding of d/c instructions and denies any further need at this time. 

## 2017-03-11 NOTE — ED Provider Notes (Signed)
[facility] EMERGENCY DEPARTMENT Provider Note   CSN: [id number] Arrival date & time: 03/11/17  0141     History   Chief Complaint Chief Complaint  Patient presents with  . Wrist Pain    HPI Stacey Blake is a 37 y.o. female.  The history is provided by the patient.  Wrist Pain  This is a new problem. The current episode started more than 1 week ago. The problem occurs constantly. The problem has not changed since onset.Pertinent negatives include no chest pain, no abdominal pain, no headaches and no shortness of breath. Nothing aggravates the symptoms. Nothing relieves the symptoms. Treatments tried: ibuprofen and wrist splint.   The treatment provided no relief.  Seen at [facility] on 03/08/17 and diagnosed with wrist pain and placed in splint and started on ibuprofen.  Symptoms have persisted.  Since her son was checking in for a toothache she decided to check in as well.    Past Medical History:  Diagnosis Date  . Anemia   . Preterm labor     Patient Active Problem List   Diagnosis Date Noted  . Supervision of pregnancy with grand multiparity in third trimester 01/23/2011  . Insufficient prenatal care 01/23/2011  . Toothache 01/23/2011  . Smoker 01/23/2011  . Sterilization consult 01/23/2011    Past Surgical History:  Procedure Laterality Date  . CESAREAN SECTION  01/23/2011   Procedure: CESAREAN SECTION;  Surgeon: Tilda Burrow, MD;  Location: [facility];  Service: Gynecology;  Laterality: N/A;  primary cesarean section of baby girl at 34  APGAR 8/9  . DILATION AND CURETTAGE OF UTERUS    . WRIST GANGLION EXCISION  2007 x2    OB History    Gravida Para Term Preterm AB Living   6 5 1 4 1 4    SAB TAB Ectopic Multiple Live Births   1     1 4        Home Medications    Prior to Admission medications   Medication Sig Start Date End Date Taking? Authorizing Provider  ibuprofen (ADVIL,MOTRIN) 800 MG tablet Take 800 mg by mouth every 8 (eight) hours as needed.    Yes [provider]  Diclofenac Sodium CR (VOLTAREN-XR) 100 MG 24 hr tablet Take 1 tablet (100 mg total) by mouth daily. 03/11/17   Taisa Deloria, MD    Family History Family History  Problem Relation Age of Onset  . Heart failure Maternal Grandmother   . Cancer Maternal Grandmother        throat  . Hypertension Maternal Grandfather   . Heart failure Maternal Grandfather   . Cancer Maternal Grandfather        prostate  . Hypertension Paternal Grandmother   . Heart failure Paternal Grandmother   . Heart failure Paternal Grandfather     Social History Social History   Tobacco Use  . Smoking status: Current Every Day Smoker    Years: 12.00  . Smokeless tobacco: Never Used  Substance Use Topics  . Alcohol use: No    Comment: Occassional   . Drug use: No     Allergies   Hydrocodone   Review of Systems Review of Systems  Respiratory: Negative for shortness of breath.   Cardiovascular: Negative for chest pain.  Gastrointestinal: Negative for abdominal pain.  Musculoskeletal: Positive for arthralgias.  Neurological: Negative for weakness, numbness and headaches.  All other systems reviewed and are negative.    Physical Exam Updated Vital Signs BP (!) 141/98 (BP  Location: Right Arm)   Pulse 81   Temp 97.8 F (36.6 C) (Oral)   Resp 18   Ht 5\' 11"  (1.803 m)   Wt 65.8 kg (145 lb)   SpO2 100%   BMI 20.22 kg/m   Physical Exam  Constitutional: She is oriented to person, place, and time. She appears well-developed and well-nourished. No distress.  HENT:  Head: Normocephalic and atraumatic.  Mouth/Throat: No oropharyngeal exudate.  Eyes: Conjunctivae are normal. Pupils are equal, round, and reactive to light.  Neck: Normal range of motion. Neck supple.  Cardiovascular: Normal rate, regular rhythm, normal heart sounds and intact distal pulses.  Pulmonary/Chest: Effort normal and breath sounds normal.  Abdominal: Soft. Bowel sounds are normal. She  exhibits no mass. There is no tenderness. There is no guarding.  Musculoskeletal: Normal range of motion. She exhibits no deformity.       Left elbow: Normal.       Left wrist: She exhibits tenderness. She exhibits normal range of motion, no bony tenderness, no swelling, no effusion, no crepitus, no deformity and no laceration.       Left forearm: Normal.       Left hand: Normal. She exhibits normal capillary refill. Normal sensation noted. Normal strength noted.  Positive Finkelstein's test of the left wrist no snuff box tenderness  Neurological: She is alert and oriented to person, place, and time. She displays normal reflexes.  Skin: Skin is warm and dry. Capillary refill takes less than 2 seconds.  Psychiatric: She has a normal mood and affect.     ED Treatments / Results   Vitals:   03/11/17 0147  BP: (!) 141/98  Pulse: 81  Resp: 18  Temp: 97.8 F (36.6 C)  SpO2: 100%    [facility] reviewed via care everywhere and are normal   Procedures Procedures (including critical care time)  Medications Ordered in ED Medications  naproxen (NAPROSYN) tablet 500 mg (500 mg Oral Given 03/11/17 0249)       Final Clinical Impressions(s) / ED Diagnoses   Final diagnoses:  De Quervain's tenosynovitis, left  Will change to naproxen. Ice for 20 minutes every 2 hours and continue to use wrist splint.  Follow up with sports medicine.  Return for worsening pain, vomiting blood inability to pass urine,  fevers > 100.4 unrelieved by medication, shortness of breath, intractable vomiting, or diarrhea, abdominal pain, Inability to tolerate liquids or food, cough, altered mental status or any concerns. No signs of systemic illness or infection. The patient is nontoxic-appearing on exam and vital signs are within normal limits.    I have reviewed the triage vital signs and the nursing notes. Pertinent labs &imaging results that were available during my care of the  patient were reviewed by me and considered in my medical decision making (see chart for details).  After history, exam, and medical workup I feel the patient has been appropriately medically screened and is safe for discharge home. Pertinent diagnoses were discussed with the patient. Patient was given return precautions.    ED Discharge Orders        Ordered    Diclofenac Sodium CR (VOLTAREN-XR) 100 MG 24 hr tablet  Daily     03/11/17 0252       Baylee Mccorkel, MD 03/11/17 [phone]

## 2017-08-01 ENCOUNTER — Ambulatory Visit (INDEPENDENT_AMBULATORY_CARE_PROVIDER_SITE_OTHER): Payer: Self-pay | Admitting: Medical

## 2017-08-01 ENCOUNTER — Encounter: Payer: Self-pay | Admitting: Medical

## 2017-08-01 ENCOUNTER — Other Ambulatory Visit (HOSPITAL_COMMUNITY)
Admission: RE | Admit: 2017-08-01 | Discharge: 2017-08-01 | Disposition: A | Discharge status: Home / Self Care | Payer: Self-pay | Source: Ambulatory Visit | Attending: Medical | Admitting: Medical

## 2017-08-01 VITALS — BP 112/72 | HR 66 | Temp 98.0°F | Resp 16 | Ht 71.0 in | Wt 150.8 lb

## 2017-08-01 DIAGNOSIS — N898 Other specified noninflammatory disorders of vagina: Secondary | ICD-10-CM | POA: Insufficient documentation

## 2017-08-01 LAB — POC URINALSYSI DIPSTICK (AUTOMATED)
BILIRUBIN UA: NEGATIVE
Blood, UA: NEGATIVE
Glucose, UA: NEGATIVE
KETONES UA: NEGATIVE
LEUKOCYTES UA: NEGATIVE
Nitrite, UA: NEGATIVE
PH UA: 6 (ref 5.0–8.0)
Protein, UA: NEGATIVE
SPEC GRAV UA: 1.015 (ref 1.010–1.025)
Urobilinogen, UA: NEGATIVE E.U./dL — AB

## 2017-08-01 MED ORDER — FLUCONAZOLE 150 MG PO TABS: 150.0000 mg | ORAL_TABLET | Freq: Once | ORAL | 0 refills | Status: DC

## 2017-08-01 MED ORDER — METRONIDAZOLE 500 MG PO TABS: 500.0000 mg | ORAL_TABLET | Freq: Two times a day (BID) | ORAL | 0 refills | Status: DC

## 2017-08-01 MED ORDER — FLUCONAZOLE 150 MG PO TABS: 150.0000 mg | ORAL_TABLET | Freq: Once | ORAL | 0 refills | Status: AC

## 2017-08-01 NOTE — Progress Notes (Signed)
Subjective:    Patient ID: Stacey Blake, female    DOB: [birth year], 37 y.o.   MRN: [id number]  HPI  Pt in with some vaginal discharge. Some vaginal discharge, mild white discharge. These symptoms present for about 2 weeks. Pt states she did use douche before symptoms occurred. She reports some vaginal odor.Some vaginal itch at times.  Pt states similar symptoms as in September 2018. Back then test came back positive for bv.  LMP- May 19,2019.    Review of Systems  Constitutional: Negative for chills, fatigue and fever.  HENT: Negative for congestion, drooling and ear pain.   Respiratory: Negative for chest tightness, shortness of breath and wheezing.   Cardiovascular: Negative for chest pain and palpitations.  Gastrointestinal: Negative for abdominal distention, abdominal pain, blood in stool, diarrhea, nausea and vomiting.  Genitourinary: Positive for vaginal discharge. Negative for decreased urine volume, difficulty urinating, dysuria, genital sores, urgency, vaginal bleeding and vaginal pain.       Vaginal discharge/odor.  Skin: Negative for pallor.  Neurological: Negative for dizziness, weakness and headaches.  Hematological: Negative for adenopathy. Does not bruise/bleed easily.  Psychiatric/Behavioral: Negative for agitation and confusion.   Past Medical History:  Diagnosis Date  . Anemia   . Preterm labor      Social History   Socioeconomic History  . Marital status: Single    Spouse name: Not on file  . Number of children: Not on file  . Years of education: Not on file  . Highest education level: Not on file  Occupational History  . Not on file  Social Needs  . Financial resource strain: Not on file  . Food insecurity:    Worry: Not on file    Inability: Not on file  . Transportation needs:    Medical: Not on file    Non-medical: Not on file  Tobacco Use  . Smoking status: Current Every Day Smoker    Years: 12.00  . Smokeless tobacco: Never Used    Substance and Sexual Activity  . Alcohol use: No    Comment: Occassional   . Drug use: No  . Sexual activity: Not Currently    Comment: wants to discuss birth control  Lifestyle  . Physical activity:    Days per week: Not on file    Minutes per session: Not on file  . Stress: Not on file  Relationships  . Social connections:    Talks on phone: Not on file    Gets together: Not on file    Attends religious service: Not on file    Active member of club or organization: Not on file    Attends meetings of clubs or organizations: Not on file    Relationship status: Not on file  . Intimate partner violence:    Fear of current or ex partner: Not on file    Emotionally abused: Not on file    Physically abused: Not on file    Forced sexual activity: Not on file  Other Topics Concern  . Not on file  Social History Narrative  . Not on file    Past Surgical History:  Procedure Laterality Date  . CESAREAN SECTION  01/23/2011   Procedure: CESAREAN SECTION;  Surgeon: Tilda BurrowJohn V Ferguson, MD;  Location: [facility];  Service: Gynecology;  Laterality: N/A;  primary cesarean section of baby girl at [id number]  APGAR 8/9  . DILATION AND CURETTAGE OF UTERUS    . WRIST GANGLION EXCISION  2007 x2  Family History  Problem Relation Age of Onset  . Heart failure Maternal Grandmother   . Cancer Maternal Grandmother        throat  . Hypertension Maternal Grandfather   . Heart failure Maternal Grandfather   . Cancer Maternal Grandfather        prostate  . Hypertension Paternal Grandmother   . Heart failure Paternal Grandmother   . Heart failure Paternal Grandfather     Allergies  Allergen Reactions  . Hydrocodone Rash    Current Outpatient Medications on File Prior to Visit  Medication Sig Dispense Refill  . Diclofenac Sodium CR (VOLTAREN-XR) 100 MG 24 hr tablet Take 1 tablet (100 mg total) by mouth daily. 10 tablet 0  . ibuprofen (ADVIL,MOTRIN) 800 MG tablet Take 800 mg by mouth every 8 (eight)  hours as needed.     No current facility-administered medications on file prior to visit.     BP 112/72   Pulse 66   Temp 98 F (36.7 C) (Oral)   Resp 16   Ht 5\' 11"  (1.803 m)   Wt 150 lb 12.8 oz (68.4 kg)   SpO2 100%   BMI 21.03 kg/m       Objective:   Physical Exam   General Appearance- Not in acute distress.  HEENT Eyes- Scleraeral/Conjuntiva-bilat- Not Yellow. Mouth & Throat- Normal.  Chest and Lung Exam Auscultation: Breath sounds:-Normal. Adventitious sounds:- No Adventitious sounds.  Cardiovascular Auscultation:Rythm - Regular. Heart Sounds -Normal heart sounds.  Abdomen Inspection:-Inspection Normal.  Palpation/Perucssion: Palpation and Percussion of the abdomen reveal- Non Tender, No Rebound tenderness, No rigidity(Guarding) and No Palpable abdominal masses.  Liver:-Normal.  Spleen:- Normal.   Back- no cva pain     Assessment & Plan:  You do have some vaginal discharge with described odor as well.  This may represent recurrent bacterial vaginosis as you had in September.  I am sending out urine ancillary studies but going ahead and prescribing Flagyl.  In addition since she reports white discharge considering possibility of yeast infection as well.  Giving you print prescription of Diflucan today.(To use as needed)  Go ahead and start Flagyl pending study results.  Follow-up in 10 to 14 days or as needed.

## 2017-08-01 NOTE — Patient Instructions (Signed)
You do have some vaginal discharge with described odor as well.  This may represent recurrent bacterial vaginosis as you had in September.  I am sending out urine ancillary studies but going ahead and prescribing Flagyl.  In addition since she reports white discharge considering possibility of yeast infection as well.  Giving you print prescription of Diflucan today.(To use as needed)  Go ahead and start Flagyl pending study results.  Follow-up in 10 to 14 days or as needed.

## 2017-08-02 LAB — URINE CYTOLOGY ANCILLARY ONLY
CHLAMYDIA, DNA PROBE: NEGATIVE
Neisseria Gonorrhea: NEGATIVE
Trichomonas: POSITIVE — AB

## 2017-08-03 ENCOUNTER — Telehealth: Payer: Self-pay

## 2017-08-03 ENCOUNTER — Telehealth: Payer: Self-pay | Admitting: Medical

## 2017-08-03 ENCOUNTER — Encounter: Payer: Self-pay | Admitting: Medical

## 2017-08-03 NOTE — Telephone Encounter (Signed)
Patient came in to office for copy of results of Urine ancillary report. Also [facility] to [facility].

## 2017-08-03 NOTE — Progress Notes (Unsigned)
Faxed copy of notification required regarding positive findings on Unrine Ancillary report to [facility].

## 2017-08-03 NOTE — Telephone Encounter (Signed)
Patient called again today about lab results  - was already given lab results this am for  office visit results 08-01-2017 see [facility] [id number] .Unable to chart in result notes   Patient asking if Trichomonas can be transmitted any other way than sexually  Result note entered by Esperanza RichtersEdward Saguier read back to patient. Where it stated that this is a  sexually transmitted disease and pt partners should be evaluated and treated

## 2017-08-05 LAB — URINE CYTOLOGY ANCILLARY ONLY: Candida vaginitis: NEGATIVE

## 2017-09-06 IMAGING — DX DG CHEST 2V
2 series · 2 of 2 positions shown · non-contrast
Comparison: None.

CLINICAL DATA: Productive cough, central chest pain and body aches
for 2 days.

EXAM:
CHEST  2 VIEW

[chest pa]
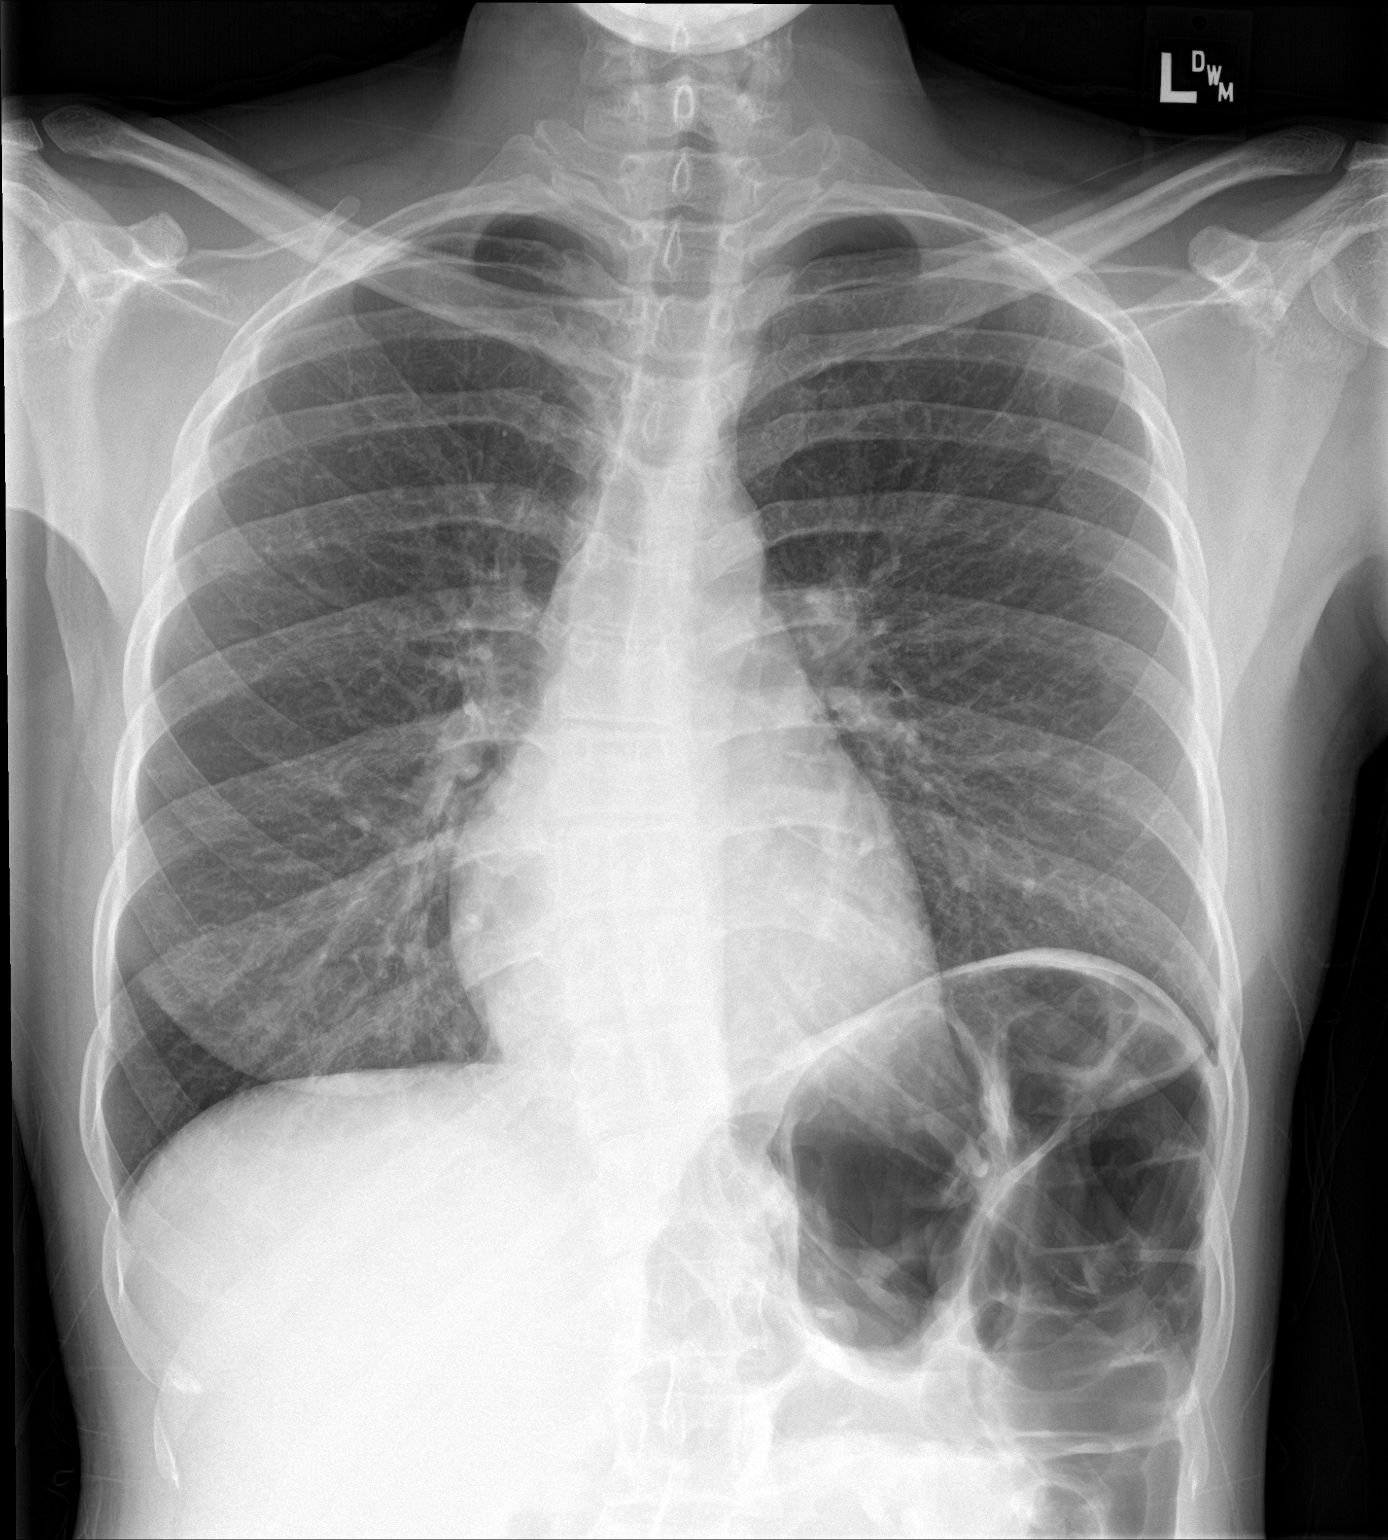

[chest lat]
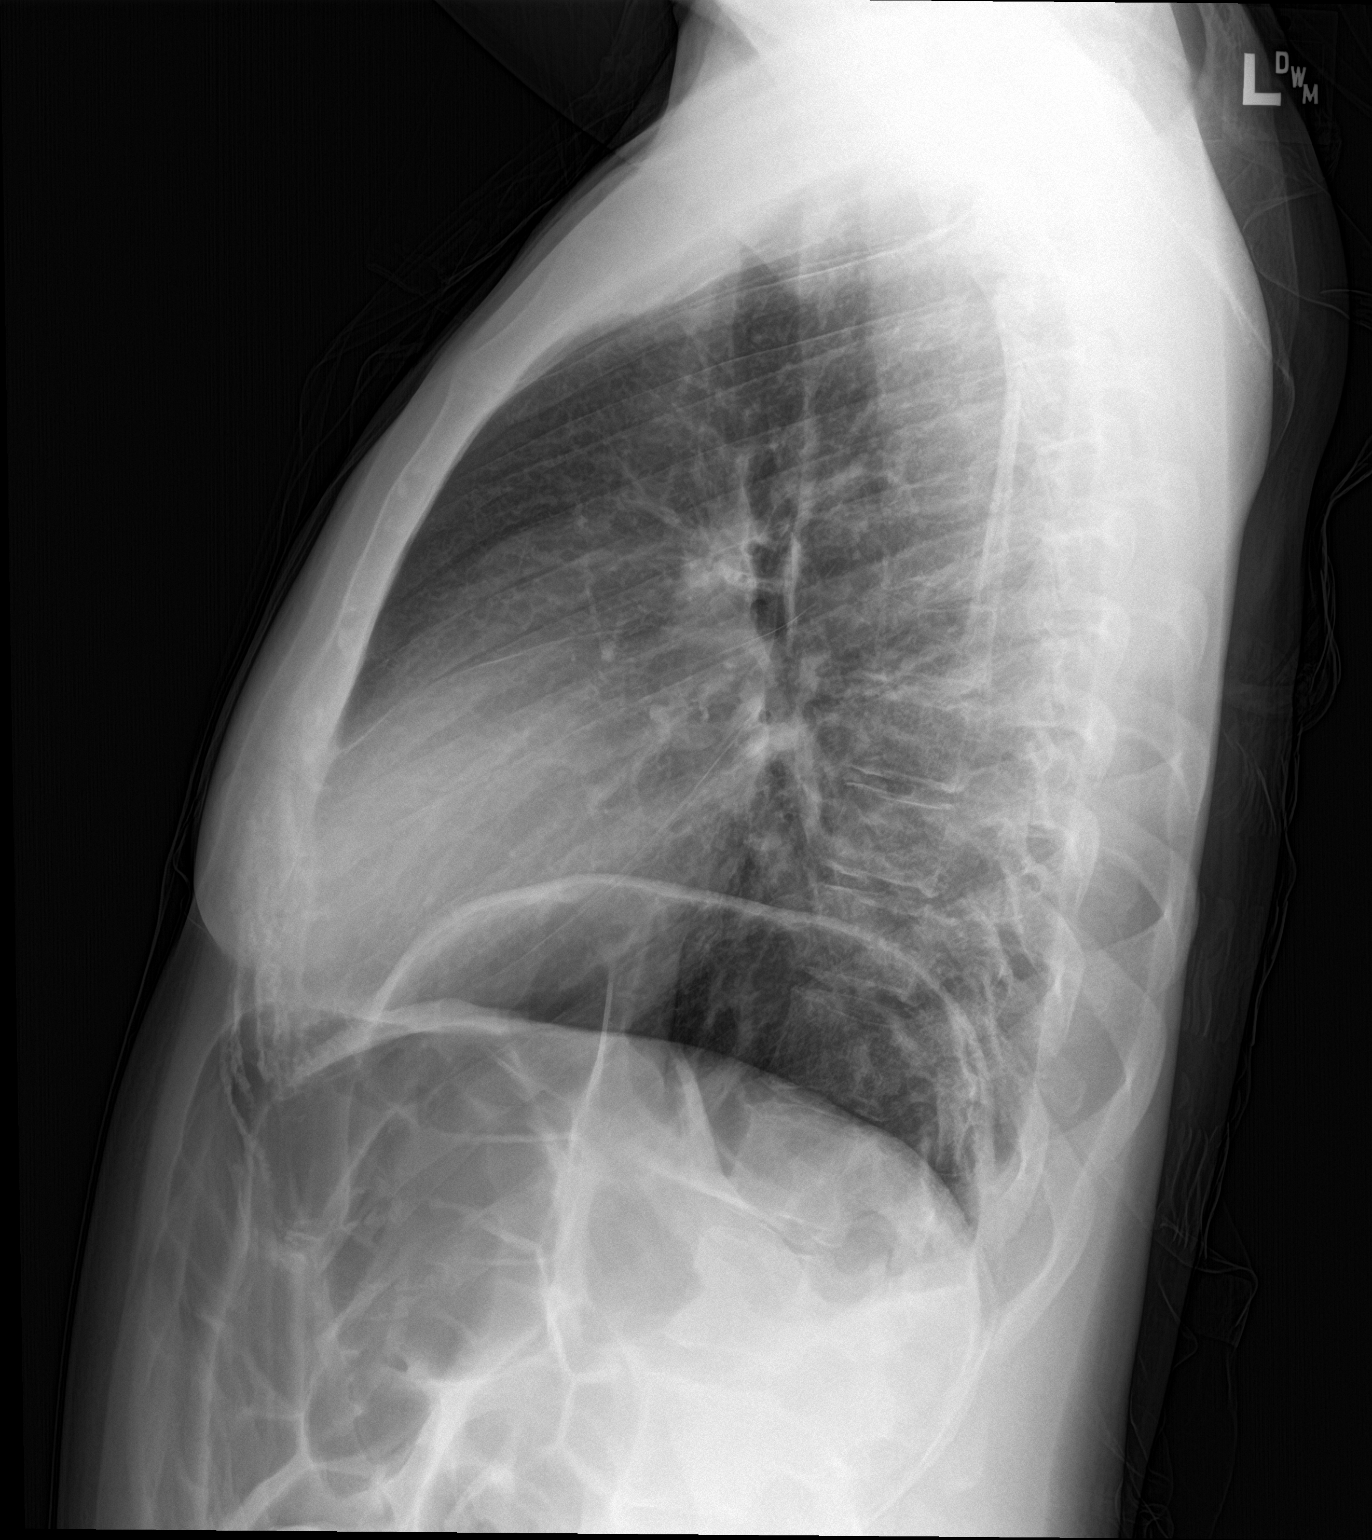

[2 of 2 positions shown; findings below may reference images not displayed]

FINDINGS: The heart size and mediastinal contours are within normal limits.
Mildly elevated LEFT hemidiaphragm. Both lungs are clear. The
visualized skeletal structures are unremarkable. Multiple loops of
gas filled bowel in LEFT abdomen.
IMPRESSION: No acute cardiopulmonary process.

## 2018-06-12 DIAGNOSIS — K0889 Other specified disorders of teeth and supporting structures: Secondary | ICD-10-CM | POA: Diagnosis not present

## 2018-06-12 DIAGNOSIS — K047 Periapical abscess without sinus: Secondary | ICD-10-CM | POA: Diagnosis not present

## 2018-08-23 ENCOUNTER — Encounter: Payer: Self-pay | Admitting: Medical

## 2018-08-23 ENCOUNTER — Other Ambulatory Visit: Payer: Self-pay

## 2018-08-23 ENCOUNTER — Ambulatory Visit (INDEPENDENT_AMBULATORY_CARE_PROVIDER_SITE_OTHER): Payer: Self-pay | Admitting: Medical

## 2018-08-23 DIAGNOSIS — B379 Candidiasis, unspecified: Secondary | ICD-10-CM

## 2018-08-23 MED ORDER — FLUCONAZOLE 150 MG PO TABS: ORAL_TABLET | ORAL | 1 refills | Status: DC

## 2018-08-23 NOTE — Patient Instructions (Signed)
You do have symptoms of probable recurrent yeast infection based on clinical history and use of antibiotics. Will rx diflucan to start today and if symptoms persist some within 3-4 days can repeat dose.  You do have slight shorter menses this month. I want you to check home pregnancy test today before using diflucan. If positive plan would change and do not take diflucan as warning for pregnancy. In that event notify me and would need to consider other options. Pt expressed understanding.  Follow up as needed but please update me in 7 days if symptoms have resolved.

## 2018-08-23 NOTE — Progress Notes (Signed)
Subjective:    Patient ID: Stacey Blake, female    DOB: 10/12/1980, 38 y.o.   MRN: [id number]  HPI  Virtual Visit via Telephone Note  I connected with Stacey Blake on 08/23/18 at  3:20 PM EDT by telephone and verified that I am speaking with the correct person using two identifiers.  Location: Patient: home Provider: office  Pt does not have bp cuff at home.   I discussed the limitations, risks, security and privacy concerns of performing an evaluation and management service by telephone and the availability of in person appointments. I also discussed with the patient that there may be a patient responsible charge related to this service. The patient expressed understanding and agreed to proceed.   History of Present Illness: Pt states 2 months ago she got antibiotic for some dental pain. She states while on antibiotic she she had vaginal itch. She got monitstat otc and it did resolve symptoms.  Then 3 weeks ago she used some antibiotic again for tooth pain. Then had some itching and white dc. So this time she want rx med.   lmp- Monday am. Shorter cycle than usual. 3 days.She states pretty sure not pregnant but not 100% sure. Although this bleeding came on at same time last month but usually cycle about 5 days   Observations/Objective: General- no acute distress. Pleasant. Oriented. Speaking normal.  Assessment and Plan: You do have symptoms of probable recurrent yeast infection based on clinical history and use of antibiotics. Will rx diflucan to start today and if symptoms persist some within 3-4 days can repeat dose.  You do have slight shorter menses this month. I want you to check home pregnancy test today before using diflucan. If positive plan would change and do not take diflucan as warning for pregnancy. In that event notify me and would need to consider other options. Pt expressed understanding.  Follow up as needed but please update me in 7 days if symptoms have  resolved.  Mackie Pai, PA-C  Follow Up Instructions:    I discussed the assessment and treatment plan with the patient. The patient was provided an opportunity to ask questions and all were answered. The patient agreed with the plan and demonstrated an understanding of the instructions.   The patient was advised to call back or seek an in-person evaluation if the symptoms worsen or if the condition fails to improve as anticipated.  I provided 15  minutes of non-face-to-face time during this encounter.   Mackie Pai, PA-C   Review of Systems  Constitutional: Negative for chills, fatigue and fever.  Respiratory: Negative for chest tightness, shortness of breath and wheezing.   Cardiovascular: Negative for chest pain and palpitations.  Gastrointestinal: Negative for abdominal pain.  Genitourinary: Positive for vaginal discharge. Negative for difficulty urinating, dysuria and vaginal pain.       White dc vaginal itch.  Neurological: Negative for dizziness and headaches.  Hematological: Negative for adenopathy. Does not bruise/bleed easily.    Past Medical History:  Diagnosis Date  . Anemia   . Preterm labor      Social History   Socioeconomic History  . Marital status: Single    Spouse name: Not on file  . Number of children: Not on file  . Years of education: Not on file  . Highest education level: Not on file  Occupational History  . Not on file  Social Needs  . Financial resource strain: Not on file  . Food insecurity  Worry: Not on file    Inability: Not on file  . Transportation needs    Medical: Not on file    Non-medical: Not on file  Tobacco Use  . Smoking status: Current Every Day Smoker    Years: 12.00  . Smokeless tobacco: Never Used  Substance and Sexual Activity  . Alcohol use: No    Comment: Occassional   . Drug use: No  . Sexual activity: Not Currently    Comment: wants to discuss birth control  Lifestyle  . Physical activity    Days  per week: Not on file    Minutes per session: Not on file  . Stress: Not on file  Relationships  . Social Musicianconnections    Talks on phone: Not on file    Gets together: Not on file    Attends religious service: Not on file    Active member of club or organization: Not on file    Attends meetings of clubs or organizations: Not on file    Relationship status: Not on file  . Intimate partner violence    Fear of current or ex partner: Not on file    Emotionally abused: Not on file    Physically abused: Not on file    Forced sexual activity: Not on file  Other Topics Concern  . Not on file  Social History Narrative  . Not on file    Past Surgical History:  Procedure Laterality Date  . CESAREAN SECTION  01/23/2011   Procedure: CESAREAN SECTION;  Surgeon: Tilda BurrowJohn V Ferguson, MD;  Location: [facility];  Service: Gynecology;  Laterality: N/A;  primary cesarean section of baby girl at [id number]  [facility] 8/9  . DILATION AND CURETTAGE OF UTERUS    . WRIST GANGLION EXCISION  2007 x2    Family History  Problem Relation Age of Onset  . Heart failure Maternal Grandmother   . Cancer Maternal Grandmother        throat  . Hypertension Maternal Grandfather   . Heart failure Maternal Grandfather   . Cancer Maternal Grandfather        prostate  . Hypertension Paternal Grandmother   . Heart failure Paternal Grandmother   . Heart failure Paternal Grandfather     Allergies  Allergen Reactions  . Hydrocodone Rash    Current Outpatient Medications on File Prior to Visit  Medication Sig Dispense Refill  . Diclofenac Sodium CR (VOLTAREN-XR) 100 MG 24 hr tablet Take 1 tablet (100 mg total) by mouth daily. 10 tablet 0  . ibuprofen (ADVIL,MOTRIN) 800 MG tablet Take 800 mg by mouth every 8 (eight) hours as needed.    . metroNIDAZOLE (FLAGYL) 500 MG tablet Take 1 tablet (500 mg total) by mouth 2 (two) times daily. 14 tablet 0   No current facility-administered medications on file prior to visit.     There  were no vitals taken for this visit.      Objective:   Physical Exam        Assessment & Plan:

## 2018-08-31 IMAGING — US US EXTREM LOW VENOUS*R*
1 series · 13 of 24 positions shown · non-contrast
Comparison: None.

CLINICAL DATA: Right lower extremity pain, mainly in the popliteal
area.



[Series 1: us extrem low venous*right* · 0.08mm/px · 13 of 27 slices shown]
[im 1/27]
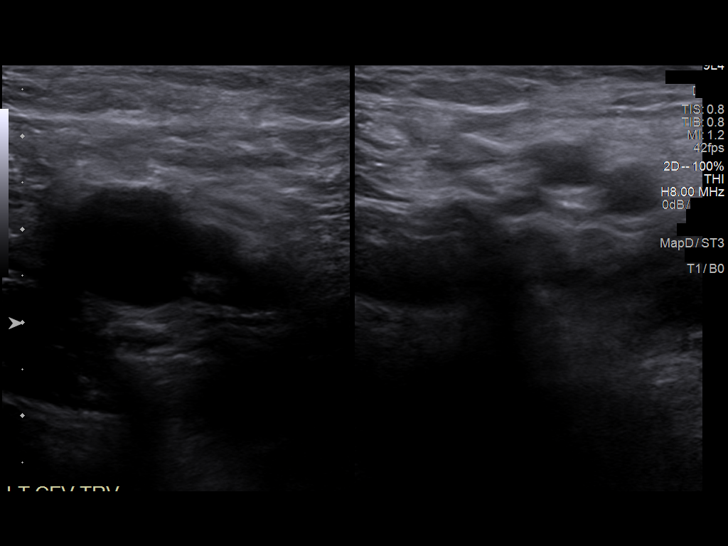
[im 3/27]
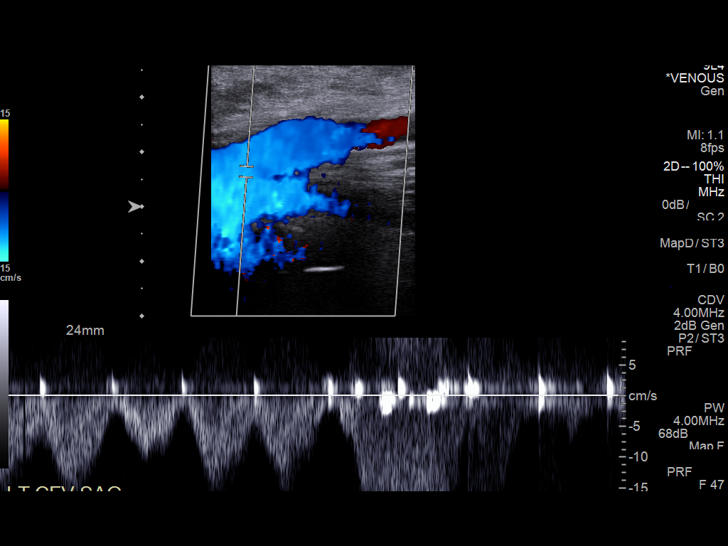
[im 5/27]
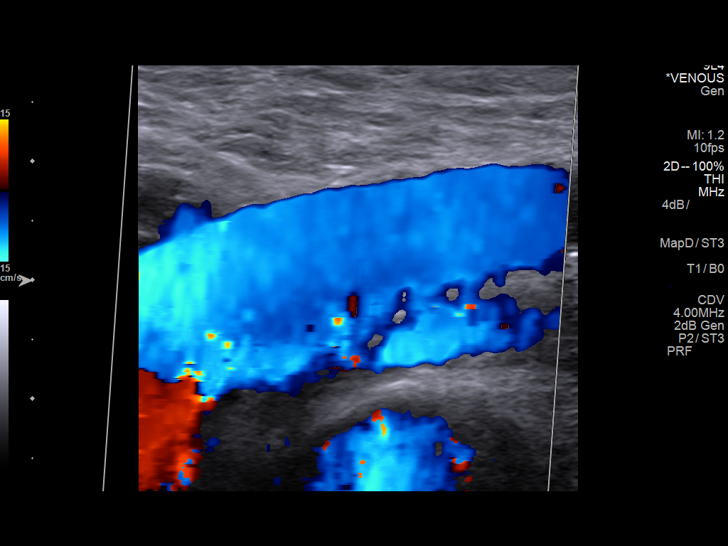
[im 7/27]
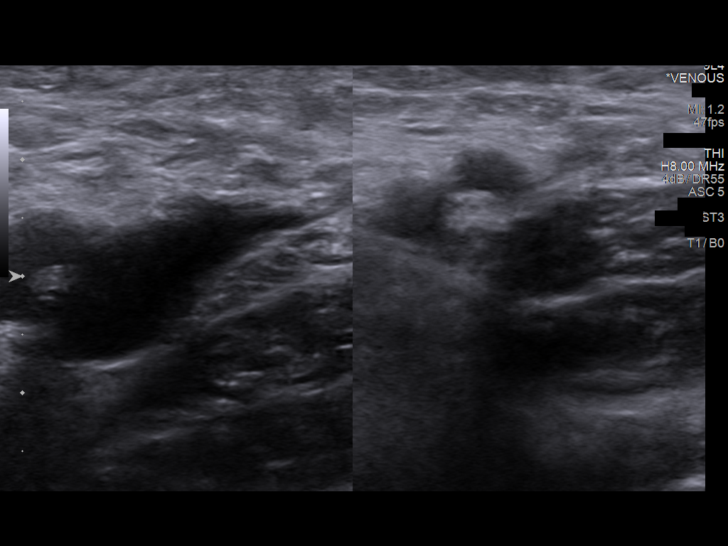
[im 10/27]
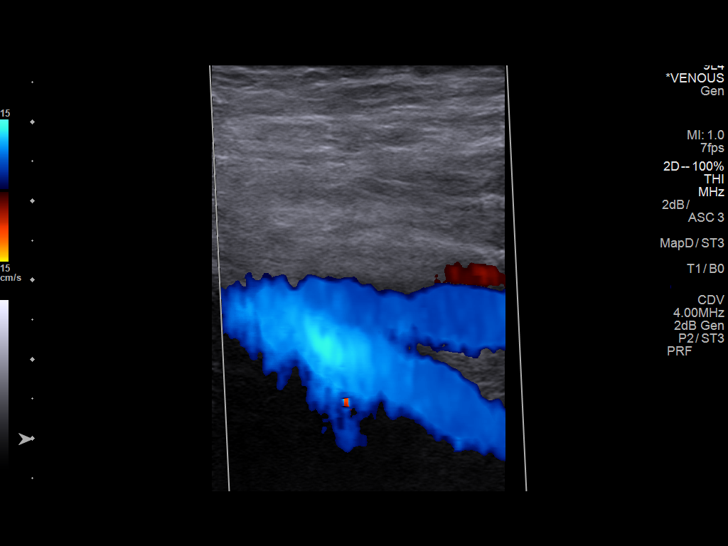
[im 12/27]
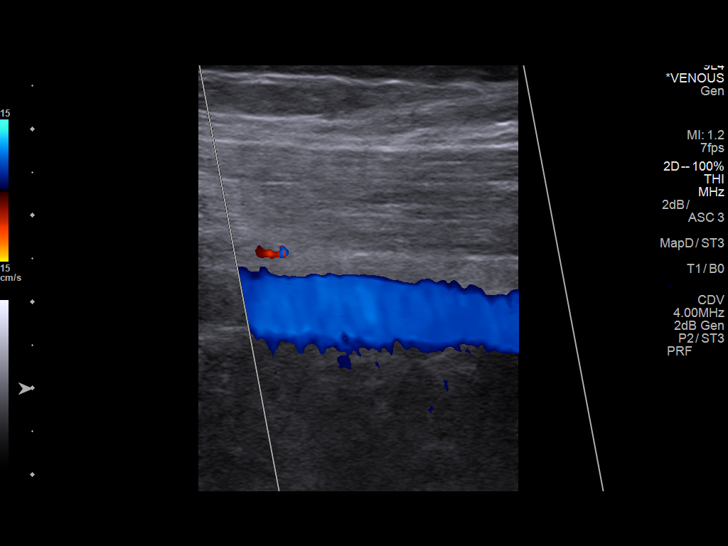
[im 14/27]
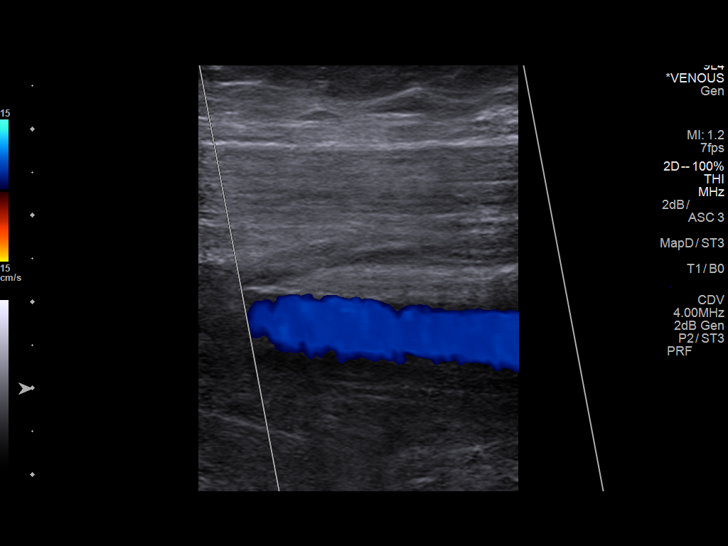
[im 15/27]
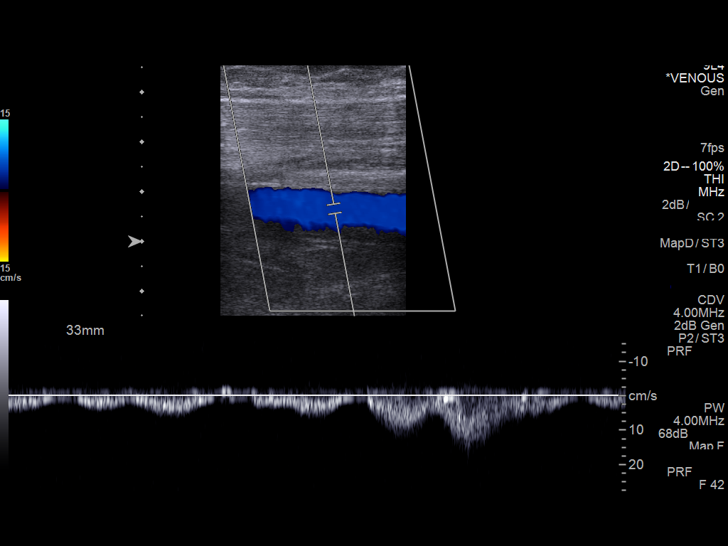
[im 17/27]
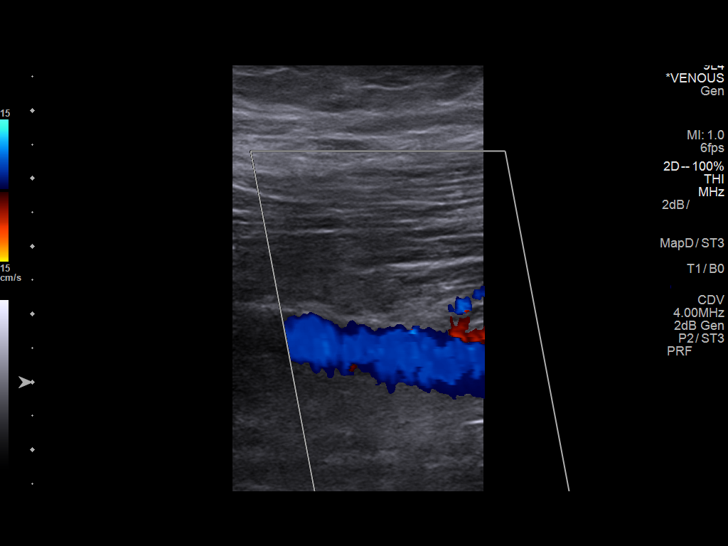
[im 20/27]
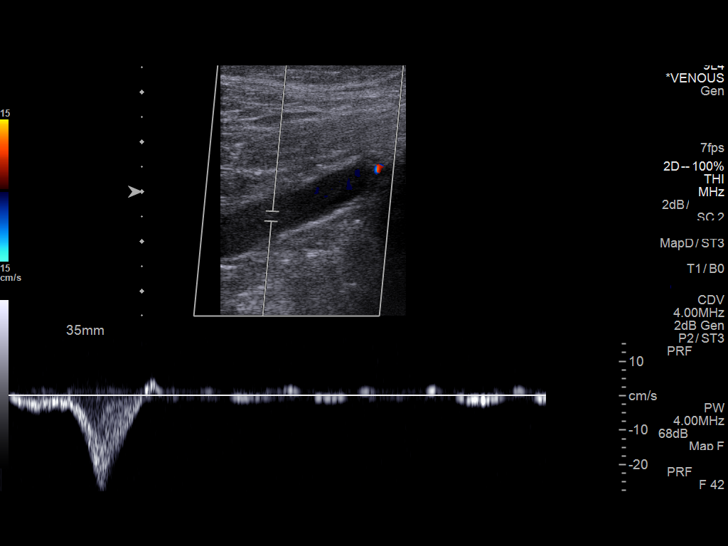
[im 22/27]
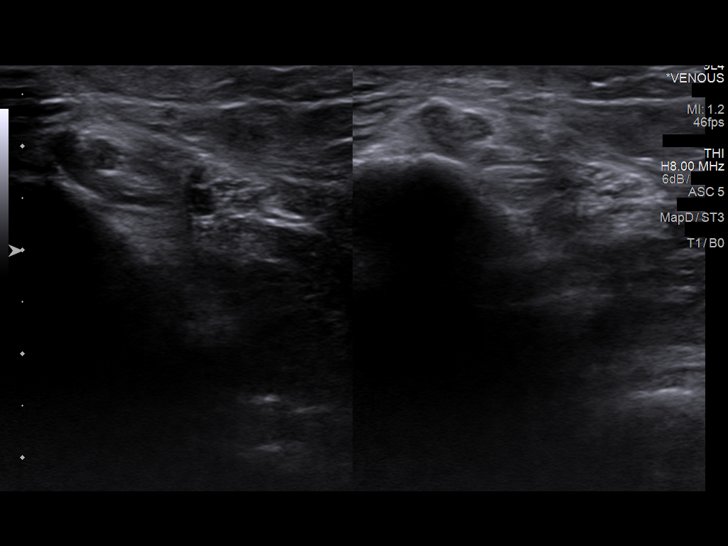
[im 24/27]
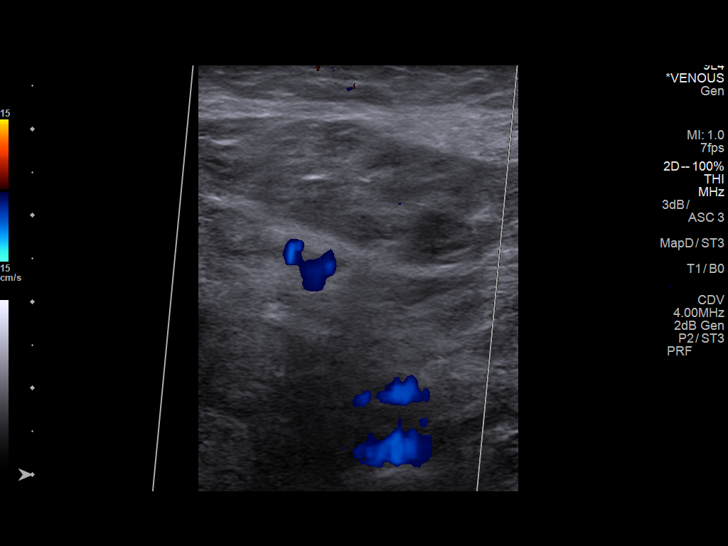
[im 27/27]
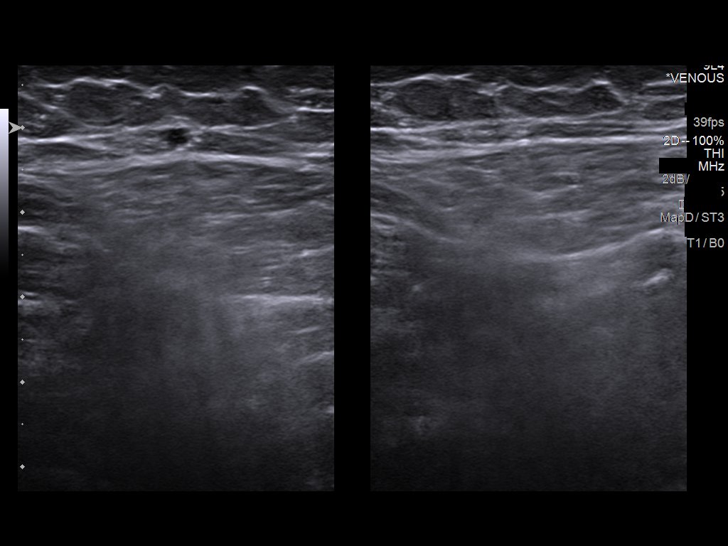

[13 of 24 positions shown; findings below may reference images not displayed]

FINDINGS: Contralateral Common Femoral Vein: Respiratory phasicity is normal
and symmetric with the symptomatic side. No evidence of thrombus.
Normal compressibility.

Common Femoral Vein: No evidence of thrombus. Normal
compressibility, respiratory phasicity and response to augmentation.

Saphenofemoral Junction: No evidence of thrombus. Normal
compressibility and flow on color Doppler imaging.

Profunda Femoral Vein: No evidence of thrombus. Normal
compressibility and flow on color Doppler imaging.

Femoral Vein: No evidence of thrombus. Normal compressibility,
respiratory phasicity and response to augmentation.

Popliteal Vein: No evidence of thrombus. Normal compressibility,
respiratory phasicity and response to augmentation.

Calf Veins: No evidence of thrombus. Normal compressibility and flow
on color Doppler imaging.

Superficial Great Saphenous Vein: No evidence of thrombus. Normal
compressibility and flow on color Doppler imaging.

Venous Reflux:  None.

Other Findings:  No popliteal mass or Baker's cyst.
IMPRESSION: No evidence of deep venous thrombosis.

## 2018-09-13 ENCOUNTER — Ambulatory Visit: Payer: Self-pay | Admitting: *Deleted

## 2018-09-13 NOTE — Telephone Encounter (Signed)
Needs appt

## 2018-09-13 NOTE — Telephone Encounter (Signed)
Summary: cycle advise    Pt has some questions about her cycle. Her cycle in June was off and she had three cycles during that month and she took a pregnancy test and it was negative /Pt has had no cycle in the month of July           Returned call to patient regarding her menstrual cycle. She had a regular period in May. And then had another one in June and at the end of June she had one of  day of bleeding. She had unprotected sex and had a pregnancy test and it was negative.  Her periods have never been irregular. She is concerned now that it is nearing the end of the month and she has not started her cycle yet. Advised that it could be that a missed period could be pregnancy, problems with her uterus or cervix or stress, diet, weight loss or menopause. She voiced understanding. Routing to [facility] at [facility] for review and recommendation. Pt requesting a call back. Reason for Disposition . Menstrual period, missed or late  Answer Assessment - Initial Assessment Questions 1. LMP:  "When did your last menstrual period begin?"     Last regular one was June 14 th for about 4 days and then a one day of bleeding on June 28 th 2. DAYS LATE: "How many days late is your menstrual period?"     4 days counting from June 14 th 3. REGULARITY: "How regular are your periods?"     very 4. PREGNANCY: "Is there any chance you are pregnant?" (e.g., unprotected intercourse, missed birth control pill, broken condom) "Have you used a home pregnancy test?"     Did have unprotected sex and have taking a pregnancy test and it was negative 5. BREASTFEEDING: "Are you breastfeeding?"     no 6. BIRTH CONTROL PILLS: "Are you taking birth control pills, or have you stopped recently?"     No birth control pills 7. DEPOPROVERA: "Has your doctor given you a shot to prevent pregnancy?" (e.g., Depoprovera injection)     no 8. CAUSE: "What do you think caused the missed period?" (e.g., stress, rapid weight loss,  excessive exercise)     Not sure 9. OTHER SYMPTOMS: "Do you have any other symptoms?" (e.g., abdominal pain)     none  Protocols used: MENSTRUAL PERIOD - MISSED OR LATE-A-AH

## 2018-09-14 ENCOUNTER — Other Ambulatory Visit: Payer: Self-pay

## 2018-09-14 ENCOUNTER — Ambulatory Visit (INDEPENDENT_AMBULATORY_CARE_PROVIDER_SITE_OTHER): Payer: Medicaid Other | Admitting: Medical

## 2018-09-14 DIAGNOSIS — N926 Irregular menstruation, unspecified: Secondary | ICD-10-CM | POA: Diagnosis not present

## 2018-09-14 DIAGNOSIS — N939 Abnormal uterine and vaginal bleeding, unspecified: Secondary | ICD-10-CM

## 2018-09-14 NOTE — Patient Instructions (Signed)
Patient has recent 2 months of irregular menses/vaginal bleeding.  Never had this before per patient report.  2 pregnancy test recently are both negative in the last week.  Patient is due for Pap smear.  Advised patient that I would like to refer to gynecologist in our building.  They could update her Pap smear and potentially get ultrasound of the uterus to evaluate if she has possible uterine fibroids.  Patient is a smoker and not presently sexually active so GYN could address safest option to prevent   After patient sees gynecologist explained to her that I could help out and give Wellbutrin tablets to assist her smoking cessation efforts.  During the interim before seeing gynecologist explained to patient to use condoms to prevent pregnancy.  Follow-up with me as needed.

## 2018-09-14 NOTE — Progress Notes (Signed)
   Subjective:    Patient ID: Stacey Blake, female    DOB: 1980-02-29, 38 y.o.   MRN: [id number]  HPI  Virtual Visit via Video Note  I connected with Daphane Dingus on 09/14/18 at  8:00 AM EDT by a video enabled telemedicine application and verified that I am speaking with the correct person using two identifiers.  Location: Patient: home Provider: office.  Pt did not check her bp today. No pulse.   I discussed the limitations of evaluation and management by telemedicine and the availability of in person appointments. The patient expressed understanding and agreed to proceed.  History of Present Illness:   Pt states June 13-17 about 4 days. Her cycles are usually 4 days and this was normal. Then states very last Sunday in had some spotting. Then Monday she bled all day. Tuesday she did not bleed at all. None since then. In May she had 2 cycle. Both those cycles were normal.  Pt this month has had no cycle yet. Pt had pregnancy test and it was negative. Pt actually took 2 tests one about 10 days ago. Then second one this Tuesday. Bot negative.  Pt never had irregular cycles before May. Coloring of presumed cycle looked normal.   Pt has become sexually active.   Pt is smoker. Pt has no htn hx.  Observations/Objective:  General-no acute distress, pleasant, oriented. Lungs- on inspection lungs appear unlabored. Neck- no tracheal deviation or jvd on inspection. Neuro- gross motor function appears intact.   Assessment and Plan: Patient has recent 2 months of irregular menses/vaginal bleeding.  Never had this before per patient report.  2 pregnancy test recently are both negative in the last week.  Patient is due for Pap smear.  Advised patient that I would like to refer to gynecologist in our building.  They could update her Pap smear and potentially get ultrasound of the uterus to evaluate if she has possible uterine fibroids.  Patient is a smoker and not presently sexually active  so GYN could address safest option to prevent   After patient sees gynecologist explained to her that I could help out and give Wellbutrin tablets to assist her smoking cessation efforts.  During the interim before seeing gynecologist explained to patient to use condoms to prevent pregnancy.  Follow-up with me as needed.  Follow Up Instructions:    I discussed the assessment and treatment plan with the patient. The patient was provided an opportunity to ask questions and all were answered. The patient agreed with the plan and demonstrated an understanding of the instructions.   The patient was advised to call back or seek an in-person evaluation if the symptoms worsen or if the condition fails to improve as anticipated.  I provided 15  minutes of non-face-to-face time during this encounter.   Mackie Pai, PA-C   Review of Systems     Objective:   Physical Exam        Assessment & Plan:

## 2018-09-19 ENCOUNTER — Ambulatory Visit: Payer: Medicaid Other | Admitting: Medical

## 2018-09-21 ENCOUNTER — Ambulatory Visit (INDEPENDENT_AMBULATORY_CARE_PROVIDER_SITE_OTHER): Payer: Medicaid Other | Admitting: Family Medicine

## 2018-09-21 ENCOUNTER — Other Ambulatory Visit (HOSPITAL_COMMUNITY)
Admission: RE | Admit: 2018-09-21 | Discharge: 2018-09-21 | Disposition: A | Discharge status: Home / Self Care | Payer: Medicaid Other | Source: Ambulatory Visit | Attending: Family Medicine | Admitting: Family Medicine

## 2018-09-21 ENCOUNTER — Encounter: Payer: Self-pay | Admitting: Family Medicine

## 2018-09-21 ENCOUNTER — Other Ambulatory Visit: Payer: Self-pay

## 2018-09-21 VITALS — BP 123/73 | HR 61 | Ht 70.0 in | Wt 146.0 lb

## 2018-09-21 DIAGNOSIS — N898 Other specified noninflammatory disorders of vagina: Secondary | ICD-10-CM | POA: Diagnosis not present

## 2018-09-21 DIAGNOSIS — Z3042 Encounter for surveillance of injectable contraceptive: Secondary | ICD-10-CM | POA: Diagnosis not present

## 2018-09-21 DIAGNOSIS — Z3202 Encounter for pregnancy test, result negative: Secondary | ICD-10-CM | POA: Diagnosis not present

## 2018-09-21 LAB — POCT URINE PREGNANCY: Preg Test, Ur: NEGATIVE

## 2018-09-21 MED ORDER — FLUCONAZOLE 150 MG PO TABS: 150.0000 mg | ORAL_TABLET | Freq: Once | ORAL | 3 refills | Status: AC

## 2018-09-21 MED ORDER — MEDROXYPROGESTERONE ACETATE 150 MG/ML IM SUSP
150.0000 mg | Freq: Once | INTRAMUSCULAR | Status: AC
Start: 2018-09-21 — End: 2018-09-21
  Administered 2018-09-21: 150 mg via INTRAMUSCULAR

## 2018-09-21 NOTE — Progress Notes (Signed)
Pt c/o irregular bleeding Discuss birth control- possibly Depo Provera

## 2018-09-21 NOTE — Progress Notes (Signed)
   Subjective:    Patient ID: Stacey Blake, female    DOB: 12-Jun-1980, 38 y.o.   MRN: [id number]  HPI Patient seen for repeat vaginal infection with vaginal irritation. Took diflucan last week, which helped, then returned. Clumpy white discharge.   Last PAP about 3 years ago. Had been on depo before, which worked well. Monogamous relationship.  I have reviewed the patients past medical, family, and social history.  I have reviewed the patient's medication list and allergies.  Review of Systems     Objective:   Physical Exam Constitutional:      Appearance: Normal appearance.  HENT:     Head: Normocephalic and atraumatic.  Cardiovascular:     Rate and Rhythm: Normal rate.     Pulses: Normal pulses.  Pulmonary:     Effort: Pulmonary effort is normal.  Abdominal:     General: Abdomen is flat.     Palpations: Abdomen is soft.  Skin:    General: Skin is warm and dry.     Capillary Refill: Capillary refill takes less than 2 seconds.  Neurological:     Mental Status: She is alert.       Assessment & Plan:  1. Vaginal irritation Diflucan prescribed. Self-swab collected. - Cervicovaginal ancillary only( [place])  2. Encounter for Depo-Provera contraception - POCT urine pregnancy - medroxyPROGESTERone (DEPO-PROVERA) injection 150 mg

## 2018-09-23 LAB — CERVICOVAGINAL ANCILLARY ONLY
Bacterial vaginitis: POSITIVE — AB
Candida vaginitis: POSITIVE — AB
Chlamydia: NEGATIVE
Neisseria Gonorrhea: NEGATIVE
Trichomonas: POSITIVE — AB

## 2018-09-25 ENCOUNTER — Other Ambulatory Visit: Payer: Self-pay

## 2018-09-25 ENCOUNTER — Other Ambulatory Visit: Payer: Self-pay | Admitting: Family Medicine

## 2018-09-25 ENCOUNTER — Telehealth: Payer: Self-pay

## 2018-09-25 DIAGNOSIS — B379 Candidiasis, unspecified: Secondary | ICD-10-CM

## 2018-09-25 DIAGNOSIS — B9689 Other specified bacterial agents as the cause of diseases classified elsewhere: Secondary | ICD-10-CM

## 2018-09-25 MED ORDER — METRONIDAZOLE 500 MG PO TABS: 500.0000 mg | ORAL_TABLET | Freq: Two times a day (BID) | ORAL | 0 refills | Status: DC

## 2018-09-25 MED ORDER — FLUCONAZOLE 150 MG PO TABS: 150.0000 mg | ORAL_TABLET | Freq: Once | ORAL | 0 refills | Status: AC

## 2018-09-25 NOTE — Progress Notes (Signed)
Error

## 2018-09-25 NOTE — Telephone Encounter (Signed)
-----   Message from Truett Mainland, DO sent at 09/25/2018  2:19 PM EDT ----- Has BV and Trichomonas. Flagyl sent to pharmacy. Please let pt know.

## 2018-09-25 NOTE — Telephone Encounter (Signed)
Called pt regarding results. Pt made aware that she tested positive for BV, yeast, and Trich. Medication was sent to the pharmacy. Understanding was voiced.  Stacey Blake, CMA

## 2018-09-29 ENCOUNTER — Telehealth: Payer: Self-pay | Admitting: Medical

## 2018-09-29 NOTE — Telephone Encounter (Signed)
Relation to pt: self  Call back number: [phone]    Reason for call:  Patient was dx with Trichomoniasis in  2018 or 2019 and states her partner verbalize he was negative, patient was dx 09/21/2018 again with Trichomoniasis  by Dr. Loma Boston GYN (GYN practice is closed today). Patient states partner will get tested but patient concern if she only has 1 partner how can her partner test negative in the past, please advise

## 2018-09-29 NOTE — Telephone Encounter (Signed)
This encounter was created in error - please disregard.

## 2018-10-03 NOTE — Telephone Encounter (Signed)
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 I explained to pt her partner should have been treated in the past at the same time(pt speculates partner was unfaithful). Pt expressed understanding. She is currently on flagyl. Gyn told pt no further treatment needed.

## 2019-04-28 ENCOUNTER — Other Ambulatory Visit: Payer: Self-pay

## 2019-04-28 ENCOUNTER — Encounter (HOSPITAL_BASED_OUTPATIENT_CLINIC_OR_DEPARTMENT_OTHER): Payer: Self-pay

## 2019-04-28 ENCOUNTER — Emergency Department (HOSPITAL_BASED_OUTPATIENT_CLINIC_OR_DEPARTMENT_OTHER)
Admission: EM | Admit: 2019-04-28 | Discharge: 2019-04-28 | Disposition: A | Discharge status: Home / Self Care | Payer: Medicaid Other | Attending: Emergency Medicine | Admitting: Emergency Medicine

## 2019-04-28 DIAGNOSIS — H6691 Otitis media, unspecified, right ear: Secondary | ICD-10-CM | POA: Diagnosis not present

## 2019-04-28 DIAGNOSIS — F17210 Nicotine dependence, cigarettes, uncomplicated: Secondary | ICD-10-CM | POA: Insufficient documentation

## 2019-04-28 DIAGNOSIS — H9201 Otalgia, right ear: Secondary | ICD-10-CM | POA: Diagnosis present

## 2019-04-28 DIAGNOSIS — H6690 Otitis media, unspecified, unspecified ear: Secondary | ICD-10-CM

## 2019-04-28 MED ORDER — AMOXICILLIN 500 MG PO CAPS: 500.0000 mg | ORAL_CAPSULE | Freq: Three times a day (TID) | ORAL | 0 refills | Status: DC

## 2019-04-28 MED ORDER — TRAMADOL HCL 50 MG PO TABS: 50.0000 mg | ORAL_TABLET | Freq: Four times a day (QID) | ORAL | 0 refills | Status: DC | PRN

## 2019-04-28 MED ORDER — TRAMADOL HCL 50 MG PO TABS
50.0000 mg | ORAL_TABLET | Freq: Once | ORAL | Status: AC
  Administered 2019-04-28: 50 mg via ORAL
  Filled 2019-04-28: qty 1

## 2019-04-28 MED ORDER — AMOXICILLIN 500 MG PO CAPS: 500.0000 mg | ORAL_CAPSULE | Freq: Three times a day (TID) | ORAL | 0 refills | Status: AC

## 2019-04-28 MED ORDER — TRAMADOL HCL 50 MG PO TABS: 50.0000 mg | ORAL_TABLET | Freq: Four times a day (QID) | ORAL | 0 refills | Status: AC | PRN

## 2019-04-28 NOTE — ED Notes (Signed)
ED Provider at bedside. 

## 2019-04-28 NOTE — ED Provider Notes (Addendum)
[facility] EMERGENCY DEPARTMENT Provider Note   CSN: [id number] Arrival date & time: 04/28/19  1714     History Chief Complaint  Patient presents with  . Otalgia    Stacey Blake is a 39 y.o. female.  The history is provided by the patient. No language interpreter was used.  Otalgia Location:  Right Behind ear:  No abnormality Quality:  Aching Severity:  Moderate Onset quality:  Gradual Duration:  2 days Timing:  Constant Progression:  Worsening Chronicity:  New Relieved by:  Nothing Worsened by:  Nothing Ineffective treatments:  None tried Associated symptoms: no cough        Past Medical History:  Diagnosis Date  . Anemia   . Preterm labor     Patient Active Problem List   Diagnosis Date Noted  . Supervision of pregnancy with grand multiparity in third trimester 01/23/2011  . Insufficient prenatal care 01/23/2011  . Toothache 01/23/2011  . Smoker 01/23/2011  . Sterilization consult 01/23/2011    Past Surgical History:  Procedure Laterality Date  . CESAREAN SECTION  01/23/2011   Procedure: CESAREAN SECTION;  Surgeon: Jonnie Kind, MD;  Location: [facility];  Service: Gynecology;  Laterality: N/A;  primary cesarean section of baby girl at [place] 8/9  . DILATION AND CURETTAGE OF UTERUS    . WRIST GANGLION EXCISION  2007 x2     OB History    Gravida  6   Para  5   Term  1   Preterm  4   AB  1   Living  4     SAB  1   TAB      Ectopic      Multiple  1   Live Births  4           Family History  Problem Relation Age of Onset  . Heart failure Maternal Grandmother   . Cancer Maternal Grandmother        throat  . Hypertension Maternal Grandfather   . Heart failure Maternal Grandfather   . Cancer Maternal Grandfather        prostate  . Hypertension Paternal Grandmother   . Heart failure Paternal Grandmother   . Heart failure Paternal Grandfather     Social History   Tobacco Use  . Smoking status: Current Every  Day Smoker    Years: 12.00  . Smokeless tobacco: Never Used  Substance Use Topics  . Alcohol use: No    Comment: Occassional   . Drug use: No    Home Medications Prior to Admission medications   Medication Sig Start Date End Date Taking? Authorizing Provider  metroNIDAZOLE (FLAGYL) 500 MG tablet Take 1 tablet (500 mg total) by mouth 2 (two) times daily. 09/25/18   Truett Mainland, DO    Allergies    Hydrocodone  Review of Systems   Review of Systems  HENT: Positive for ear pain.   Respiratory: Negative for cough.   All other systems reviewed and are negative.   Physical Exam Updated Vital Signs BP (!) 142/103 (BP Location: Right Arm)   Pulse 62   Temp 98.4 F (36.9 C) (Oral)   Resp 18   Ht 5\' 11"  (1.803 m)   Wt 68.5 kg   LMP 04/11/2019   SpO2 100%   BMI 21.06 kg/m   Physical Exam Vitals reviewed.  HENT:     Left Ear: Tympanic membrane normal.     Ears:  Comments: Erythematous tm. Canal tender, pain with exam     Nose: Nose normal.     Mouth/Throat:     Mouth: Mucous membranes are moist.     Comments: Lower teeth removed, upper, dental decay no obvious abscess Eyes:     Pupils: Pupils are equal, round, and reactive to light.  Cardiovascular:     Rate and Rhythm: Normal rate.     Pulses: Normal pulses.  Pulmonary:     Effort: Pulmonary effort is normal.  Abdominal:     General: Abdomen is flat.  Musculoskeletal:        General: Normal range of motion.     Cervical back: Normal range of motion.  Skin:    General: Skin is warm.  Neurological:     General: No focal deficit present.  Psychiatric:        Mood and Affect: Mood normal.     ED Results / Procedures / Treatments   Labs (all labs ordered are listed, but only abnormal results are displayed) Labs Reviewed - No data to display  EKG None  Radiology No results found.  Procedures Procedures (including critical care time)  Medications Ordered in ED Medications  traMADol (ULTRAM)  tablet 50 mg (has no administration in time range)    ED Course  I have reviewed the triage vital signs and the nursing notes.  Pertinent labs & imaging results that were available during my care of the patient were reviewed by me and considered in my medical decision making (see chart for details).    MDM Rules/Calculators/A&P                      Rx for amoxicillian and tramadol.  Pt requested medication go to a different pharmacy.  I called pharmacy and cancelled 1st prescription.  I was unable to prescribe to second pharmacy.  Pt given paper rx.  Final Clinical Impression(s) / ED Diagnoses Final diagnoses:  Acute otitis media, unspecified otitis media type  Right ear pain    Rx / DC Orders ED Discharge Orders         Ordered    amoxicillin (AMOXIL) 500 MG capsule  3 times daily     04/28/19 1753    traMADol (ULTRAM) 50 MG tablet  Every 6 hours PRN,   Status:  Discontinued     04/28/19 1753    traMADol (ULTRAM) 50 MG tablet  Every 6 hours PRN     04/28/19 1757        An After Visit Summary was printed and given to the patient.    Elson Areas, PA-C 04/28/19 1759    Elson Areas, PA-C 04/28/19 1826    Pollyann Savoy, MD 04/28/19 [phone]
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
## 2019-04-28 NOTE — Discharge Instructions (Addendum)
Return if any problems.

## 2019-09-03 DIAGNOSIS — H5213 Myopia, bilateral: Secondary | ICD-10-CM | POA: Diagnosis not present

## 2019-09-13 DIAGNOSIS — H5203 Hypermetropia, bilateral: Secondary | ICD-10-CM | POA: Diagnosis not present

## 2019-09-13 DIAGNOSIS — H52223 Regular astigmatism, bilateral: Secondary | ICD-10-CM | POA: Diagnosis not present

## 2019-10-16 DIAGNOSIS — R200 Anesthesia of skin: Secondary | ICD-10-CM | POA: Diagnosis not present

## 2019-10-16 DIAGNOSIS — G5621 Lesion of ulnar nerve, right upper limb: Secondary | ICD-10-CM | POA: Diagnosis not present

## 2019-12-04 DIAGNOSIS — R61 Generalized hyperhidrosis: Secondary | ICD-10-CM | POA: Diagnosis not present

## 2019-12-04 DIAGNOSIS — R251 Tremor, unspecified: Secondary | ICD-10-CM | POA: Diagnosis not present

## 2019-12-04 DIAGNOSIS — R519 Headache, unspecified: Secondary | ICD-10-CM | POA: Diagnosis not present

## 2019-12-04 DIAGNOSIS — G930 Cerebral cysts: Secondary | ICD-10-CM | POA: Diagnosis not present

## 2019-12-04 DIAGNOSIS — R079 Chest pain, unspecified: Secondary | ICD-10-CM | POA: Diagnosis not present

## 2019-12-04 DIAGNOSIS — R002 Palpitations: Secondary | ICD-10-CM | POA: Diagnosis not present

## 2019-12-04 DIAGNOSIS — Z3202 Encounter for pregnancy test, result negative: Secondary | ICD-10-CM | POA: Diagnosis not present

## 2019-12-24 DIAGNOSIS — R519 Headache, unspecified: Secondary | ICD-10-CM | POA: Diagnosis not present

## 2019-12-24 DIAGNOSIS — G930 Cerebral cysts: Secondary | ICD-10-CM | POA: Diagnosis not present

## 2019-12-24 DIAGNOSIS — F410 Panic disorder [episodic paroxysmal anxiety] without agoraphobia: Secondary | ICD-10-CM | POA: Diagnosis not present

## 2020-01-11 DIAGNOSIS — N926 Irregular menstruation, unspecified: Secondary | ICD-10-CM | POA: Diagnosis not present

## 2020-01-11 DIAGNOSIS — B9689 Other specified bacterial agents as the cause of diseases classified elsewhere: Secondary | ICD-10-CM | POA: Diagnosis not present

## 2020-01-11 DIAGNOSIS — N760 Acute vaginitis: Secondary | ICD-10-CM | POA: Diagnosis not present

## 2020-02-04 DIAGNOSIS — M25512 Pain in left shoulder: Secondary | ICD-10-CM | POA: Diagnosis not present

## 2020-02-04 DIAGNOSIS — M7542 Impingement syndrome of left shoulder: Secondary | ICD-10-CM | POA: Diagnosis not present

## 2020-02-04 DIAGNOSIS — R200 Anesthesia of skin: Secondary | ICD-10-CM | POA: Diagnosis not present

## 2020-11-25 ENCOUNTER — Telehealth: Payer: Self-pay | Admitting: Medical

## 2020-11-25 DIAGNOSIS — Z3202 Encounter for pregnancy test, result negative: Secondary | ICD-10-CM | POA: Diagnosis not present

## 2020-11-25 DIAGNOSIS — Y999 Unspecified external cause status: Secondary | ICD-10-CM | POA: Diagnosis not present

## 2020-11-25 DIAGNOSIS — M47812 Spondylosis without myelopathy or radiculopathy, cervical region: Secondary | ICD-10-CM | POA: Diagnosis not present

## 2020-11-25 DIAGNOSIS — M545 Low back pain, unspecified: Secondary | ICD-10-CM | POA: Diagnosis not present

## 2020-11-25 DIAGNOSIS — M542 Cervicalgia: Secondary | ICD-10-CM | POA: Diagnosis not present
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
## 2020-11-25 NOTE — Telephone Encounter (Signed)
Is there anything OTC she can take?

## 2020-11-25 NOTE — Telephone Encounter (Signed)
Called pt and made her aware we were not able to access her PA since it was through the hospital and advised patient she could take salonpas for pain

## 2021-01-25 DIAGNOSIS — G5601 Carpal tunnel syndrome, right upper limb: Secondary | ICD-10-CM | POA: Diagnosis not present

## 2021-01-26 ENCOUNTER — Telehealth: Payer: Self-pay

## 2021-01-26 NOTE — Telephone Encounter (Signed)
Transition Care Management Unsuccessful Follow-up Telephone Call  Date of discharge and from where:  01/25/2021-[facility]   Attempts:  1st Attempt  Reason for unsuccessful TCM follow-up call:  Left voice message

## 2021-01-27 NOTE — Telephone Encounter (Signed)
Transition Care Management Unsuccessful Follow-up Telephone Call  Date of discharge and from where:  01/25/2021-[facility]  Attempts:  2nd Attempt  Reason for unsuccessful TCM follow-up call:  Left voice message

## 2021-01-28 NOTE — Telephone Encounter (Signed)
Transition Care Management Unsuccessful Follow-up Telephone Call  Date of discharge and from where:  01/25/2021 from [facility]  Attempts:  3rd Attempt  Reason for unsuccessful TCM follow-up call:  Unable to reach patient

## 2021-01-29 DIAGNOSIS — M25531 Pain in right wrist: Secondary | ICD-10-CM | POA: Diagnosis not present

## 2021-01-29 DIAGNOSIS — G5601 Carpal tunnel syndrome, right upper limb: Secondary | ICD-10-CM | POA: Diagnosis not present

## 2021-06-11 ENCOUNTER — Other Ambulatory Visit: Payer: Medicaid Other

## 2021-06-11 DIAGNOSIS — N926 Irregular menstruation, unspecified: Secondary | ICD-10-CM | POA: Diagnosis not present

## 2021-06-11 NOTE — Progress Notes (Addendum)
Patient sent to lab.  ? ?Patient has had some irregular spotting since her last period and had unprotected sex around day 9/10 of her cycle. Patient states she has had back cramping and abdominal cramps that she usually doesn't have.  Patient would like confirmation of pregnancy by blood work. Armandina Stammer RN  ?

## 2021-06-12 ENCOUNTER — Encounter: Payer: Self-pay | Admitting: Family Medicine

## 2021-06-12 LAB — BETA HCG QUANT (REF LAB): hCG Quant: <1 m[IU]/mL

## 2021-08-05 ENCOUNTER — Ambulatory Visit: Payer: Medicaid Other | Admitting: Family Medicine

## 2021-08-05 ENCOUNTER — Encounter: Payer: Self-pay | Admitting: Family Medicine

## 2021-08-05 ENCOUNTER — Other Ambulatory Visit (HOSPITAL_COMMUNITY)
Admission: RE | Admit: 2021-08-05 | Discharge: 2021-08-05 | Disposition: A | Discharge status: Home / Self Care | Payer: Medicaid Other | Source: Ambulatory Visit | Attending: Family Medicine | Admitting: Family Medicine

## 2021-08-05 VITALS — BP 103/65 | HR 64 | Ht 71.0 in | Wt 146.0 lb

## 2021-08-05 DIAGNOSIS — Z113 Encounter for screening for infections with a predominantly sexual mode of transmission: Secondary | ICD-10-CM | POA: Diagnosis not present

## 2021-08-05 DIAGNOSIS — Z01419 Encounter for gynecological examination (general) (routine) without abnormal findings: Secondary | ICD-10-CM | POA: Diagnosis not present

## 2021-08-05 DIAGNOSIS — Z1231 Encounter for screening mammogram for malignant neoplasm of breast: Secondary | ICD-10-CM | POA: Diagnosis not present

## 2021-08-05 NOTE — Progress Notes (Signed)
GYNECOLOGY ANNUAL PREVENTATIVE CARE ENCOUNTER NOTE  Subjective:   Stacey Blake is a 41 y.o. [id number] female here for a routine annual gynecologic exam.  Current complaints: none. Night sweats 2-3 times a week.   Denies abnormal vaginal bleeding, discharge, pelvic pain, problems with intercourse or other gynecologic concerns.    Gynecologic History Patient's last menstrual period was 07/27/2021 (within days). Patient is sexually active  Contraception: condoms Last Pap: 2017 Last mammogram: n/a. Colorectal Cancer Screening: n/a  The pregnancy intention screening data noted above was reviewed. Potential methods of contraception were discussed. The patient elected to proceed with No data recorded.   Obstetric History OB History  Gravida Para Term Preterm AB Living  6 5 1 4 1 4   SAB IAB Ectopic Multiple Live Births  1     1 4     # Outcome Date GA Lbr Len/2nd Weight Sex Delivery Anes PTL Lv  6 Preterm 01/23/11 [redacted]w[redacted]d  5 lb 8.7 oz (2.515 kg) F CS-LVertical Spinal  LIV     Birth Comments: preterm appearance c/w 36 wks AGA  5 Preterm 12/10/09    M Vag-Spont     4 Preterm 01/05/07    M Vag-Spont   LIV  3 SAB 01/15/06          2 Term 11/06/99    M Vag-Spont   LIV  1A Preterm 09/23/97    F Vag-Spont   ND  1B Preterm 09/23/97    F Vag-Spont   FD    Past Medical History:  Diagnosis Date   Anemia    Preterm labor     Past Surgical History:  Procedure Laterality Date   CESAREAN SECTION  01/23/2011   Procedure: CESAREAN SECTION;  Surgeon: 11/23/97, MD;  Location: [facility];  Service: Gynecology;  Laterality: N/A;  primary cesarean section of baby girl at 89  APGAR 8/9   DILATION AND CURETTAGE OF UTERUS     WRIST GANGLION EXCISION  2007 x2    Current Outpatient Medications on File Prior to Visit  Medication Sig Dispense Refill   amoxicillin (AMOXIL) 500 MG capsule Take 1 capsule (500 mg total) by mouth 3 (three) times daily. (Patient not taking: Reported on 08/05/2021) 30  capsule 0   metroNIDAZOLE (FLAGYL) 500 MG tablet Take 1 tablet (500 mg total) by mouth 2 (two) times daily. (Patient not taking: Reported on 08/05/2021) 14 tablet 0   No current facility-administered medications on file prior to visit.    Allergies  Allergen Reactions   Hydrocodone Rash    Social History   Socioeconomic History   Marital status: Single    Spouse name: Not on file   Number of children: Not on file   Years of education: Not on file   Highest education level: Not on file  Occupational History   Not on file  Tobacco Use   Smoking status: Every Day    Packs/day: 0.25    Years: 12.00    Total pack years: 3.00    Types: Cigarettes   Smokeless tobacco: Never  Vaping Use   Vaping Use: Never used  Substance and Sexual Activity   Alcohol use: No    Comment: Occassional    Drug use: No   Sexual activity: Yes    Comment: wants to discuss birth control  Other Topics Concern   Not on file  Social History Narrative   Not on file   Social Determinants of 08/07/2021  Strain: Not on file  Food Insecurity: Not on file  Transportation Needs: Not on file  Physical Activity: Not on file  Stress: Not on file  Social Connections: Not on file  Intimate Partner Violence: Not on file    Family History  Problem Relation Age of Onset   Heart failure Maternal Grandmother    Cancer Maternal Grandmother        throat   Hypertension Maternal Grandfather    Heart failure Maternal Grandfather    Cancer Maternal Grandfather        prostate   Hypertension Paternal Grandmother    Heart failure Paternal Grandmother    Heart failure Paternal Grandfather     The following portions of the patient's history were reviewed and updated as appropriate: allergies, current medications, past family history, past medical history, past social history, past surgical history and problem list.  Review of Systems Pertinent items are noted in HPI.   Objective:  BP 103/65    Pulse 64   Ht 5\' 11"  (1.803 m)   Wt 146 lb (66.2 kg)   LMP 07/27/2021 (Within Days)   BMI 20.36 kg/m  Wt Readings from Last 3 Encounters:  08/05/21 146 lb (66.2 kg)  04/28/19 151 lb (68.5 kg)  09/21/18 146 lb (66.2 kg)     Chaperone present during exam  CONSTITUTIONAL: Well-developed, well-nourished female in no acute distress.  HENT:  Normocephalic, atraumatic, External right and left ear normal. Oropharynx is clear and moist EYES: Conjunctivae and EOM are normal. Pupils are equal, round, and reactive to light. No scleral icterus.  NECK: Normal range of motion, supple, no masses.  Normal thyroid.   CARDIOVASCULAR: Normal heart rate noted, regular rhythm RESPIRATORY: Clear to auscultation bilaterally. Effort and breath sounds normal, no problems with respiration noted. BREASTS: Symmetric in size. 1cm smooth, mobile, nontender nodule in the right breast at edge of areola at 5 o'clock. No skin changes or discharge. No other masses, skin changes, nipple drainage, or lymphadenopathy. ABDOMEN: Soft, normal bowel sounds, no distention noted.  No tenderness, rebound or guarding.  PELVIC: Normal appearing external genitalia; normal appearing vaginal mucosa and cervix.  No abnormal discharge noted. MUSCULOSKELETAL: Normal range of motion. No tenderness.  No cyanosis, clubbing, or edema.  2+ distal pulses. SKIN: Skin is warm and dry. No rash noted. Not diaphoretic. No erythema. No pallor. NEUROLOGIC: Alert and oriented to person, place, and time. Normal reflexes, muscle tone coordination. No cranial nerve deficit noted. PSYCHIATRIC: Normal mood and affect. Normal behavior. Normal judgment and thought content.  Assessment:  Annual gynecologic examination with pap smear   Plan:  1. Well Woman Exam Will follow up results of pap smear and manage accordingly. Mammogram scheduled STD testing discussed. Patient requested testing - Cytology - PAP( [place])  2. Screening for STD (sexually  transmitted disease) - Cytology - PAP( [place])  3. Encounter for screening mammogram for malignant neoplasm of breast  - MM 3D SCREEN BREAST BILATERAL; Future   Routine preventative health maintenance measures emphasized. Please refer to After Visit Summary for other counseling recommendations.    09/23/18, DO [facility]

## 2021-08-10 LAB — CYTOLOGY - PAP
Chlamydia: NEGATIVE
Comment: NEGATIVE
Comment: NEGATIVE
Comment: NEGATIVE
Comment: NORMAL
Diagnosis: NEGATIVE
High risk HPV: NEGATIVE
Neisseria Gonorrhea: NEGATIVE
Trichomonas: NEGATIVE

## 2021-08-11 ENCOUNTER — Telehealth (HOSPITAL_BASED_OUTPATIENT_CLINIC_OR_DEPARTMENT_OTHER): Payer: Self-pay

## 2021-09-06 DIAGNOSIS — U071 COVID-19: Secondary | ICD-10-CM | POA: Diagnosis not present

## 2021-09-09 ENCOUNTER — Telehealth (HOSPITAL_BASED_OUTPATIENT_CLINIC_OR_DEPARTMENT_OTHER): Payer: Self-pay

## 2021-12-18 ENCOUNTER — Other Ambulatory Visit (HOSPITAL_COMMUNITY)
Admission: RE | Admit: 2021-12-18 | Discharge: 2021-12-18 | Disposition: A | Discharge status: Home / Self Care | Payer: Medicaid Other | Source: Ambulatory Visit | Attending: Family Medicine | Admitting: Family Medicine

## 2021-12-18 ENCOUNTER — Encounter: Payer: Self-pay | Admitting: Family Medicine

## 2021-12-18 ENCOUNTER — Ambulatory Visit (INDEPENDENT_AMBULATORY_CARE_PROVIDER_SITE_OTHER): Payer: Medicaid Other | Admitting: Family Medicine

## 2021-12-18 VITALS — BP 116/67 | HR 67 | Wt 147.0 lb

## 2021-12-18 DIAGNOSIS — N939 Abnormal uterine and vaginal bleeding, unspecified: Secondary | ICD-10-CM | POA: Insufficient documentation

## 2021-12-18 DIAGNOSIS — N760 Acute vaginitis: Secondary | ICD-10-CM

## 2021-12-18 DIAGNOSIS — B9689 Other specified bacterial agents as the cause of diseases classified elsewhere: Secondary | ICD-10-CM | POA: Diagnosis not present

## 2021-12-18 LAB — POCT URINE PREGNANCY: Preg Test, Ur: NEGATIVE

## 2021-12-18 NOTE — Addendum Note (Signed)
Addended by: Phill Myron on: 12/18/2021 09:23 AM   Modules accepted: Orders

## 2021-12-18 NOTE — Progress Notes (Signed)
   Subjective:    Patient ID: Stacey Blake, female    DOB: 04-14-1980, 41 y.o.   MRN: [id number]  HPI Patient comes in with concerns of vaginal bleeding that started 10/24.  She had some spotting and light bleeding during that day, then spotting for the next couple of days.  She has not seen anything today so far.  She did have normal cycle earlier this month on 10/7 -10/11.  Her cycles have been regular to this point.  She does not note any abnormal vaginal discharge.  She is sexually active with a partner.   Review of Systems     Objective:   Physical Exam Vitals reviewed. Exam conducted with a chaperone present.  Constitutional:      Appearance: Normal appearance.  Cardiovascular:     Rate and Rhythm: Normal rate and regular rhythm.  Abdominal:     Hernia: There is no hernia in the left inguinal area or right inguinal area.  Genitourinary:    Labia:        Right: No rash, tenderness or lesion.        Left: No rash, tenderness or lesion.      Vagina: No signs of injury and foreign body. No vaginal discharge, erythema, tenderness or bleeding.     Cervix: No cervical motion tenderness, friability or erythema.  Lymphadenopathy:     Lower Body: No right inguinal adenopathy. No left inguinal adenopathy.  Skin:    Capillary Refill: Capillary refill takes less than 2 seconds.  Neurological:     General: No focal deficit present.     Mental Status: She is alert.  Psychiatric:        Mood and Affect: Mood normal.        Behavior: Behavior normal.        Thought Content: Thought content normal.        Judgment: Judgment normal.        Assessment & Plan:  1. Abnormal vaginal bleeding Check vaginal cultures.  We will check UPT.  At this point, we will continue to monitor and have patient track her cycle.  If this continues, may need ultrasound and/or endometrial sampling - Cervicovaginal ancillary only( [place])

## 2021-12-18 NOTE — Addendum Note (Signed)
Addended by: Phill Myron on: 12/18/2021 09:21 AM   Modules accepted: Orders

## 2021-12-18 NOTE — Progress Notes (Signed)
Patient having irregular periods. Patient states she had two in this month. LMP:Oct 7-11 normal period and Oct 24- present: lighter in nature. Patient also wondering about menopause. Kathrene Alu RN

## 2021-12-21 LAB — CERVICOVAGINAL ANCILLARY ONLY
Bacterial Vaginitis (gardnerella): POSITIVE — AB
Candida Glabrata: NEGATIVE
Candida Vaginitis: NEGATIVE
Chlamydia: NEGATIVE
Comment: NEGATIVE
Comment: NEGATIVE
Comment: NEGATIVE
Comment: NEGATIVE
Comment: NEGATIVE
Comment: NORMAL
Neisseria Gonorrhea: NEGATIVE
Trichomonas: NEGATIVE

## 2021-12-22 ENCOUNTER — Encounter: Payer: Self-pay | Admitting: Family Medicine

## 2021-12-22 MED ORDER — METRONIDAZOLE 500 MG PO TABS: 500.0000 mg | ORAL_TABLET | Freq: Two times a day (BID) | ORAL | 0 refills | Status: AC

## 2021-12-22 NOTE — Addendum Note (Signed)
Addended by: Truett Mainland on: 12/22/2021 11:43 AM   Modules accepted: Orders

## 2021-12-28 ENCOUNTER — Telehealth: Payer: Self-pay

## 2021-12-28 NOTE — Telephone Encounter (Signed)
-----   Message from Maurine Minister, [place] sent at 12/28/2021  8:21 AM EST ----- Regarding: Request Call Back She is scheduled for a follow up on 02/04/2022 with Dr. Nehemiah Settle.  Her cycle is late.  She said that she went to the ED (not sure which one) and pregnancy test was negative.  Wanted to speak with a nurse.  She is also placed on the wait list and she is aware.

## 2021-12-28 NOTE — Telephone Encounter (Signed)
Patient states she was suppose to get a cycle on Nov 2 but it has not started. Patient states she went to ER on Saturday for a headache and pregnancy test there was negative.  Patient states she is freaking out because her cycle has not come on (not neccesarily pregnancy) she just wants to have her cycle.   Explained that stress and things can cause some irregularity to periods or if she is entering a perimenopausal state. Patient states that this doesn't explain her not having a period and this worries her. Will send provider a note and patient is scheduled to follow up in December.  Kathrene Alu RN

## 2022-02-04 ENCOUNTER — Ambulatory Visit: Payer: Medicaid Other | Admitting: Family Medicine

## 2022-07-02 DIAGNOSIS — S90222A Contusion of left lesser toe(s) with damage to nail, initial encounter: Secondary | ICD-10-CM | POA: Diagnosis not present

## 2022-07-08 DIAGNOSIS — S90122A Contusion of left lesser toe(s) without damage to nail, initial encounter: Secondary | ICD-10-CM | POA: Diagnosis not present

## 2022-07-10 ENCOUNTER — Emergency Department (HOSPITAL_BASED_OUTPATIENT_CLINIC_OR_DEPARTMENT_OTHER): Payer: Medicaid Other

## 2022-07-10 ENCOUNTER — Emergency Department (HOSPITAL_BASED_OUTPATIENT_CLINIC_OR_DEPARTMENT_OTHER)
Admission: EM | Admit: 2022-07-10 | Discharge: 2022-07-10 | Disposition: A | Discharge status: Home / Self Care | Payer: Medicaid Other | Attending: Emergency Medicine | Admitting: Emergency Medicine

## 2022-07-10 ENCOUNTER — Other Ambulatory Visit: Payer: Self-pay

## 2022-07-10 ENCOUNTER — Encounter (HOSPITAL_BASED_OUTPATIENT_CLINIC_OR_DEPARTMENT_OTHER): Payer: Self-pay

## 2022-07-10 DIAGNOSIS — T148XXA Other injury of unspecified body region, initial encounter: Secondary | ICD-10-CM | POA: Insufficient documentation

## 2022-07-10 DIAGNOSIS — M542 Cervicalgia: Secondary | ICD-10-CM | POA: Diagnosis not present

## 2022-07-10 DIAGNOSIS — M545 Low back pain, unspecified: Secondary | ICD-10-CM | POA: Diagnosis not present

## 2022-07-10 DIAGNOSIS — S0990XA Unspecified injury of head, initial encounter: Secondary | ICD-10-CM | POA: Insufficient documentation

## 2022-07-10 DIAGNOSIS — R200 Anesthesia of skin: Secondary | ICD-10-CM | POA: Diagnosis not present

## 2022-07-10 DIAGNOSIS — S0003XA Contusion of scalp, initial encounter: Secondary | ICD-10-CM | POA: Diagnosis not present

## 2022-07-10 DIAGNOSIS — S39012A Strain of muscle, fascia and tendon of lower back, initial encounter: Secondary | ICD-10-CM | POA: Diagnosis not present

## 2022-07-10 DIAGNOSIS — R102 Pelvic and perineal pain: Secondary | ICD-10-CM | POA: Diagnosis not present

## 2022-07-10 DIAGNOSIS — S161XXA Strain of muscle, fascia and tendon at neck level, initial encounter: Secondary | ICD-10-CM | POA: Diagnosis not present

## 2022-07-10 DIAGNOSIS — W228XXA Striking against or struck by other objects, initial encounter: Secondary | ICD-10-CM | POA: Diagnosis not present

## 2022-07-10 DIAGNOSIS — R519 Headache, unspecified: Secondary | ICD-10-CM | POA: Diagnosis not present

## 2022-07-10 MED ORDER — TRAMADOL HCL 50 MG PO TABS: 50.0000 mg | ORAL_TABLET | Freq: Four times a day (QID) | ORAL | 0 refills | Status: AC | PRN

## 2022-07-10 MED ORDER — METHOCARBAMOL 500 MG PO TABS: 500.0000 mg | ORAL_TABLET | Freq: Three times a day (TID) | ORAL | 0 refills | Status: AC | PRN

## 2022-07-10 MED ORDER — KETOROLAC TROMETHAMINE 60 MG/2ML IM SOLN
60.0000 mg | Freq: Once | INTRAMUSCULAR | Status: AC
  Administered 2022-07-10: 60 mg via INTRAMUSCULAR
  Filled 2022-07-10: qty 2

## 2022-07-10 MED ORDER — NAPROXEN 375 MG PO TABS: 375.0000 mg | ORAL_TABLET | Freq: Two times a day (BID) | ORAL | 0 refills | Status: AC
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
## 2022-10-18 ENCOUNTER — Emergency Department (HOSPITAL_BASED_OUTPATIENT_CLINIC_OR_DEPARTMENT_OTHER)
Admission: EM | Admit: 2022-10-18 | Discharge: 2022-10-18 | Disposition: A | Discharge status: Home / Self Care | Payer: Medicaid Other | Attending: Emergency Medicine | Admitting: Emergency Medicine

## 2022-10-18 ENCOUNTER — Other Ambulatory Visit: Payer: Self-pay

## 2022-10-18 ENCOUNTER — Encounter (HOSPITAL_BASED_OUTPATIENT_CLINIC_OR_DEPARTMENT_OTHER): Payer: Self-pay | Admitting: Emergency Medicine

## 2022-10-18 DIAGNOSIS — M65841 Other synovitis and tenosynovitis, right hand: Secondary | ICD-10-CM | POA: Diagnosis not present

## 2022-10-18 DIAGNOSIS — M659 Synovitis and tenosynovitis, unspecified: Secondary | ICD-10-CM | POA: Insufficient documentation

## 2022-10-18 DIAGNOSIS — M79601 Pain in right arm: Secondary | ICD-10-CM | POA: Diagnosis present

## 2022-10-18 MED ORDER — DICLOFENAC SODIUM 1 % EX GEL: 4.0000 g | Freq: Four times a day (QID) | CUTANEOUS | 0 refills | Status: AC

## 2022-10-18 MED ORDER — DICLOFENAC SODIUM 1 % EX GEL: 4.0000 g | Freq: Four times a day (QID) | CUTANEOUS | 0 refills | Status: DC

## 2022-10-18 MED ORDER — KETOROLAC TROMETHAMINE 15 MG/ML IJ SOLN
15.0000 mg | Freq: Once | INTRAMUSCULAR | Status: AC
  Administered 2022-10-18: 15 mg via INTRAMUSCULAR
  Filled 2022-10-18: qty 1

## 2022-10-18 NOTE — ED Triage Notes (Signed)
Pt having intermittent right lower extremely pain with some numbness and cramping in the hand.  Pt states she has had issues for over one year.  Pt states worse last two weeks.

## 2022-10-18 NOTE — Discharge Instructions (Addendum)
Use the splint and sling for your comfort.  You can take your arm out of the sling as you need to and you should take it out at least 4 times a day.  Please follow-up with the hand surgeon in the office.  I prescribed you a gel that you can try and rub on the area that hurts and see if it helps.

## 2022-10-18 NOTE — ED Provider Notes (Signed)
[facility] Provider Note   CSN: [id number] Arrival date & time: 10/18/22  0908     History  Chief Complaint  Patient presents with   Arm Pain    Stacey Blake is a 42 y.o. female.  42 yo F with a chief complaint of right arm pain and numbness.  This has been an ongoing issue for her she been having pain to that right arm ever since she got a promotion at her job a couple weeks ago.  She has been doing more repetitive motions with that arm now.  Has a history of a cyst that was removed, had it removed twice.  She had seen the hand surgeon for carpal tunnel syndrome but had not followed up with them in the office and was it sounds like lost to follow-up.  She denies any recent trauma.  Feels like is gotten worse over the past 24 to 48 hours.  Had trouble sleeping last night due to discomfort.   Arm Pain       Home Medications Prior to Admission medications   Medication Sig Start Date End Date Taking? Authorizing Provider  diclofenac Sodium (VOLTAREN) 1 % GEL Apply 4 g topically 4 (four) times daily. 10/18/22  Yes Melene Plan, DO  amoxicillin (AMOXIL) 500 MG capsule Take 1 capsule (500 mg total) by mouth 3 (three) times daily. 04/28/19   Elson Areas, PA-C  methocarbamol (ROBAXIN) 500 MG tablet Take 1 tablet (500 mg total) by mouth 3 (three) times daily as needed for muscle spasms. 07/10/22   Harris, Cammy Copa, PA-C  metroNIDAZOLE (FLAGYL) 500 MG tablet Take 1 tablet (500 mg total) by mouth 2 (two) times daily. 12/22/21   Levie Heritage, DO  naproxen (NAPROSYN) 375 MG tablet Take 1 tablet (375 mg total) by mouth 2 (two) times daily with a meal. 07/10/22   Harris, Abigail, PA-C  traMADol (ULTRAM) 50 MG tablet Take 1 tablet (50 mg total) by mouth every 6 (six) hours as needed. 07/10/22   Arthor Captain, PA-C      Allergies    Hydrocodone    Review of Systems   Review of Systems  Physical Exam Updated Vital Signs BP 125/73 (BP  Location: Left Arm)   Pulse 69   Temp 97.7 F (36.5 C) (Oral)   Resp 16   Ht 5\' 11"  (1.803 m)   Wt 68 kg   LMP 10/05/2022   SpO2 100%   BMI 20.92 kg/m  Physical Exam Vitals and nursing note reviewed.  Constitutional:      General: She is not in acute distress.    Appearance: She is well-developed. She is not diaphoretic.  HENT:     Head: Normocephalic and atraumatic.  Eyes:     Pupils: Pupils are equal, round, and reactive to light.  Cardiovascular:     Rate and Rhythm: Normal rate and regular rhythm.     Heart sounds: No murmur heard.    No friction rub. No gallop.  Pulmonary:     Effort: Pulmonary effort is normal.     Breath sounds: No wheezing or rales.  Abdominal:     General: There is no distension.     Palpations: Abdomen is soft.     Tenderness: There is no abdominal tenderness.  Musculoskeletal:        General: No tenderness.     Cervical back: Normal range of motion and neck supple.     Comments: Pain diffusely  about the forearm.  No obvious bony tenderness.  Able to pronate and supinate without significant issue.  Pulse intact.  She points along the radial aspect of the forearm as area of most discomfort.  Motor intact.  Patient states that she cannot feel anything along any nerve distribution to her hand.  Skin:    General: Skin is warm and dry.  Neurological:     Mental Status: She is alert and oriented to person, place, and time.  Psychiatric:        Behavior: Behavior normal.     ED Results / Procedures / Treatments   Labs (all labs ordered are listed, but only abnormal results are displayed) Labs Reviewed - No data to display  EKG None  Radiology No results found.  Procedures Procedures    Medications Ordered in ED Medications  ketorolac (TORADOL) 15 MG/ML injection 15 mg (has no administration in time range)    ED Course/ Medical Decision Making/ A&P                                 Medical Decision Making Risk Prescription drug  management.   42 yo F with a chief complaint of right arm pain.  This mostly to the forearm.  She has tingling mostly about the hand.  She has had issues like this previously.  Clinically I think most likely she has de Quervain's tenosynovitis.  She does work in a position that has repetitive motions.  I will place in removable wrist splint sling.  Have her follow-up with hand surgery in the office.  9:37 AM:  I have discussed the diagnosis/risks/treatment options with the patient.  Evaluation and diagnostic testing in the emergency department does not suggest an emergent condition requiring admission or immediate intervention beyond what has been performed at this time.  They will follow up with Hand. We also discussed returning to the ED immediately if new or worsening sx occur. We discussed the sx which are most concerning (e.g., sudden worsening pain, fever, inability to tolerate by mouth) that necessitate immediate return. Medications administered to the patient during their visit and any new prescriptions provided to the patient are listed below.  Medications given during this visit Medications  ketorolac (TORADOL) 15 MG/ML injection 15 mg (has no administration in time range)     The patient appears reasonably screen and/or stabilized for discharge and I doubt any other medical condition or other [facility] requiring further screening, evaluation, or treatment in the ED at this time prior to discharge.          Final Clinical Impression(s) / ED Diagnoses Final diagnoses:  Tenosynovitis    Rx / DC Orders ED Discharge Orders          Ordered    diclofenac Sodium (VOLTAREN) 1 % GEL  4 times daily        10/18/22 0930              Melene Plan, DO 10/18/22 [id number]

## 2022-10-19 DIAGNOSIS — S52121A Displaced fracture of head of right radius, initial encounter for closed fracture: Secondary | ICD-10-CM | POA: Diagnosis not present

## 2022-10-19 DIAGNOSIS — G5601 Carpal tunnel syndrome, right upper limb: Secondary | ICD-10-CM | POA: Diagnosis not present

## 2022-10-19 DIAGNOSIS — M79641 Pain in right hand: Secondary | ICD-10-CM | POA: Diagnosis not present

## 2023-07-11 DIAGNOSIS — J018 Other acute sinusitis: Secondary | ICD-10-CM | POA: Diagnosis not present

## 2023-07-11 DIAGNOSIS — J019 Acute sinusitis, unspecified: Secondary | ICD-10-CM | POA: Diagnosis not present

## 2023-07-11 DIAGNOSIS — Z79899 Other long term (current) drug therapy: Secondary | ICD-10-CM | POA: Diagnosis not present

## 2023-07-11 DIAGNOSIS — F17210 Nicotine dependence, cigarettes, uncomplicated: Secondary | ICD-10-CM | POA: Diagnosis not present

## 2023-08-10 ENCOUNTER — Encounter (HOSPITAL_BASED_OUTPATIENT_CLINIC_OR_DEPARTMENT_OTHER): Payer: Self-pay | Admitting: Emergency Medicine

## 2023-08-10 ENCOUNTER — Emergency Department (HOSPITAL_BASED_OUTPATIENT_CLINIC_OR_DEPARTMENT_OTHER)
Admission: EM | Admit: 2023-08-10 | Discharge: 2023-08-10 | Disposition: A | Discharge status: Home / Self Care | Payer: Worker's Compensation | Attending: Emergency Medicine | Admitting: Emergency Medicine

## 2023-08-10 ENCOUNTER — Other Ambulatory Visit: Payer: Self-pay

## 2023-08-10 DIAGNOSIS — W278XXA Contact with other nonpowered hand tool, initial encounter: Secondary | ICD-10-CM | POA: Diagnosis not present

## 2023-08-10 DIAGNOSIS — S61217A Laceration without foreign body of left little finger without damage to nail, initial encounter: Secondary | ICD-10-CM | POA: Insufficient documentation

## 2023-08-10 DIAGNOSIS — F17210 Nicotine dependence, cigarettes, uncomplicated: Secondary | ICD-10-CM | POA: Insufficient documentation
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
## 2023-08-10 NOTE — ED Provider Notes (Signed)
 [place] EMERGENCY DEPARTMENT AT [facility] [place] Provider Note   CSN: [id number] Arrival date & time: 08/10/23  1617     Patient presents with: No chief complaint on file.   Stacey Blake is a 43 y.o. female.   HPI   43 year old female presents emergency department with complaints of left little finger laceration.  Patient was opening a box with a box cutter and accidentally cut her left pinky finger on the backside.  Denies any weakness or sensory deficits.  States that she has some difficulty controlling the bleeding and was told to come to the emergency department by work staff for evaluation.  Reports being up-to-date on Tdap.  Denies trauma/pain elsewhere.  Past medical history significant for anemia  Prior to Admission medications   Medication Sig Start Date End Date Taking? Authorizing Provider  amoxicillin  (AMOXIL ) 500 MG capsule Take 1 capsule (500 mg total) by mouth 3 (three) times daily. 04/28/19   Sofia, Leslie K, PA-C  diclofenac  Sodium (VOLTAREN ) 1 % GEL Apply 4 g topically 4 (four) times daily. 10/18/22   Albertus Hughs, DO  methocarbamol  (ROBAXIN ) 500 MG tablet Take 1 tablet (500 mg total) by mouth 3 (three) times daily as needed for muscle spasms. 07/10/22   Harris, Abigail, PA-C  metroNIDAZOLE  (FLAGYL ) 500 MG tablet Take 1 tablet (500 mg total) by mouth 2 (two) times daily. 12/22/21   Stinson, Jacob J, DO  naproxen  (NAPROSYN ) 375 MG tablet Take 1 tablet (375 mg total) by mouth 2 (two) times daily with a meal. 07/10/22   Harris, Abigail, PA-C  traMADol  (ULTRAM ) 50 MG tablet Take 1 tablet (50 mg total) by mouth every 6 (six) hours as needed. 07/10/22   Harris, Abigail, PA-C    Allergies: Hydrocodone    Review of Systems  All other systems reviewed and are negative.   Updated Vital Signs There were no vitals taken for this visit.  Physical Exam Vitals and nursing note reviewed.  Constitutional:      General: She is not in acute distress.    Appearance: She is  well-developed.  HENT:     Head: Normocephalic and atraumatic.   Eyes:     Conjunctiva/sclera: Conjunctivae normal.    Cardiovascular:     Rate and Rhythm: Normal rate and regular rhythm.     Heart sounds: No murmur heard. Pulmonary:     Effort: Pulmonary effort is normal. No respiratory distress.     Breath sounds: Normal breath sounds.  Abdominal:     Palpations: Abdomen is soft.     Tenderness: There is no abdominal tenderness.   Musculoskeletal:        General: No swelling.     Cervical back: Neck supple.     Comments: Full range of motion digits of left hand.  Patient with very superficial 1.5 linear laceration appreciated mid aspect dorsally fifth digit of right hand.  No nailbed involvement does not span joint.  Bleeding controlled with direct pressure.  No obvious foreign body present.   Skin:    General: Skin is warm and dry.     Capillary Refill: Capillary refill takes less than 2 seconds.   Neurological:     Mental Status: She is alert.   Psychiatric:        Mood and Affect: Mood normal.     (all labs ordered are listed, but only abnormal results are displayed) Labs Reviewed - No data to display  EKG: None  Radiology: No results found.   Aaron Aas  Laceration Repair  Date/Time: 08/10/2023 4:52 PM  Performed by: Milton Butter, PA Authorized by: Mayo Butter, PA   Consent:    Consent obtained:  Verbal   Consent given by:  Patient   Risks, benefits, and alternatives were discussed: yes     Risks discussed:  Poor wound healing, nerve damage, need for additional repair, infection, pain and poor cosmetic result   Alternatives discussed:  Delayed treatment, no treatment and observation Universal protocol:    Procedure explained and questions answered to patient or proxy's satisfaction: yes     Relevant documents present and verified: yes     Patient identity confirmed:  Verbally with patient Anesthesia:    Anesthesia method:  None Laceration details:     Location:  Finger   Finger location:  L small finger   Length (cm):  1.5 Exploration:    Limited defect created (wound extended): no     Hemostasis achieved with:  Direct pressure and tourniquet   Imaging outcome: foreign body not noted     Wound exploration: wound explored through full range of motion and entire depth of wound visualized     Contaminated: no   Treatment:    Area cleansed with:  Saline   Amount of cleaning:  Extensive   Irrigation solution:  Sterile water   Irrigation volume:  300cc   Irrigation method:  Pressure wash   Visualized foreign bodies/material removed: no     Debridement:  None   Undermining:  None   Scar revision: no   Skin repair:    Repair method:  Tissue adhesive Approximation:    Approximation:  Close Repair type:    Repair type:  Simple Post-procedure details:    Dressing:  Splint for protection   Procedure completion:  Tolerated well, no immediate complications    Medications Ordered in the ED - No data to display                                  Medical Decision Making  This patient presents to the ED for concern of finger laceration, this involves an extensive number of treatment options, and is a complaint that carries with it a high risk of complications and morbidity.  The differential diagnosis includes ligamentous/tendinous injury, neurovascular compromise, fracture, dislocation, foreign body retainment, other   Co morbidities that complicate the patient evaluation  See HPI   Additional history obtained:  Additional history obtained from EMR External records from outside source obtained and reviewed including hospital records   Lab Tests:  N/a   Imaging Studies ordered:  N/a   Cardiac Monitoring: / EKG:  N/a   Consultations Obtained:  N/a   Problem List / ED Course / Critical interventions / Medication management  Finger laceration I ordered medication including dermabond    Reevaluation of the  patient after these medicines showed that the patient improved I have reviewed the patients home medicines and have made adjustments as needed   Social Determinants of Health:  Chronic cigarette use.  Denies illicit drug use.   Test / Admission - Considered:  Finger laceration Vitals signs significant for hypertension blood pressure 149/99. Otherwise within normal range and stable throughout visit. 43 year old female presents emergency department with complaints of left little finger laceration.  Patient was opening a box with a box cutter and accidentally cut her left pinky finger on the backside.  Denies any weakness or  sensory deficits.  States that she has some difficulty controlling the bleeding and was told to come to the emergency department by work staff for evaluation.  Reports being up-to-date on Tdap.  Denies trauma/pain elsewhere. On exam, superficial laceration appreciated on the dorsal aspect of left fifth digit.  No obvious deep structure involvement.  Full range of motion affected digit.  Area cleansed and repaired using tissue adhesive as above.  Patient given splint to help aid protection while area is healing.  Will recommend recommend follow-up with PCP in the outpatient setting for reassessment.  Treatment plan discussed with patient and she knowledge understanding was agreeable to said plan.  Patient well-appearing, afebrile in no acute distress. Worrisome signs and symptoms were discussed with the patient, and the patient acknowledged understanding to return to the ED if noticed. Patient was stable upon discharge.       Final diagnoses:  None    ED Discharge Orders     None          [place] Butter, [place] 08/10/23 1653    Afton Horse T, DO 08/11/23 1528

## 2023-08-10 NOTE — Discharge Instructions (Signed)
 Your laceration was repaired using Dermabond or tissue adhesive.  Recommend keeping this dry.  You may wash your hands like normal but avoid excessive moisture as this will increase the likelihood of the glue failing.  Wear splint for protection.  Look for signs of infection and return if they develop such as redness, fever, pus drainage.  Please not hesitate to return if the worrisome signs and symptoms we discussed become apparent.

## 2023-09-10 ENCOUNTER — Emergency Department (HOSPITAL_BASED_OUTPATIENT_CLINIC_OR_DEPARTMENT_OTHER)
Admission: EM | Admit: 2023-09-10 | Discharge: 2023-09-10 | Disposition: A | Discharge status: Home / Self Care | Attending: Emergency Medicine | Admitting: Emergency Medicine

## 2023-09-10 ENCOUNTER — Other Ambulatory Visit: Payer: Self-pay

## 2023-09-10 ENCOUNTER — Encounter (HOSPITAL_BASED_OUTPATIENT_CLINIC_OR_DEPARTMENT_OTHER): Payer: Self-pay

## 2023-09-10 DIAGNOSIS — G5602 Carpal tunnel syndrome, left upper limb: Secondary | ICD-10-CM | POA: Insufficient documentation

## 2023-09-10 DIAGNOSIS — M25532 Pain in left wrist: Secondary | ICD-10-CM | POA: Diagnosis present

## 2023-09-10 MED ORDER — NAPROXEN 500 MG PO TABS: 500.0000 mg | ORAL_TABLET | Freq: Two times a day (BID) | ORAL | 0 refills | Status: AC

## 2023-09-10 NOTE — Discharge Instructions (Addendum)
 1.  Start wearing the wrist splint as much as possible.  May remove it for icing and elevating the wrist.  Try to wear it with activity so you are not continuing to over fresh the wrist.  If the wrist splint provided in the emergency department is not comfortable, you can consider going to a medical supply store and finding an alternative splint that fits you comfortably but also supports the wrist. 2.  Take naproxen  twice a day to help with pain and inflammation.  Try to ice the wrist several times a day. 3.  Schedule a follow-up appointment with the orthopedic specialist listed in your discharge instructions.  Sometimes carpal tunnel and nerve entrapment can be treated with medications, immobilization and physical therapy.  Sometimes it requires surgery to improve the symptoms.

## 2023-09-10 NOTE — ED Triage Notes (Addendum)
 Pt states that she has numbness in her fingers running up her forearm on the left side only that started Thursday. It has intermittently been happening and is also painful. Denies other symptoms. No hx diabetes.   Aleck FORBES Molt, RN

## 2023-09-10 NOTE — ED Provider Notes (Signed)
 [place] EMERGENCY DEPARTMENT AT [facility] [place] Provider Note   CSN: [id number] Arrival date & time: 09/10/23  9070     Patient presents with: Numbness   Stacey Blake is a 43 y.o. female.   HPI Patient reports she started getting a lot of pain in the left wrist about 4 days ago.  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  She reports over the past 2 weeks she probably has been doing more line work requiring her repetitive wrist motion.  She reports a lot of pain on the volar aspect of the left wrist in the midline and slightly medial.  Pain goes into her palm and most pronounced in the middle digit but also involves the fourth digit.  Patient reports sometimes she is having numbness and has to flex and extend the fingers to this relieve numbness.  There is no pain or numbness above the level of the elbow.  Patient has no prior history of carpal tunnel syndrome.    Prior to Admission medications   Medication Sig Start Date End Date Taking? Authorizing Provider  naproxen  (NAPROSYN ) 500 MG tablet Take 1 tablet (500 mg total) by mouth 2 (two) times daily. 09/10/23  Yes Armenta Canning, MD  amoxicillin  (AMOXIL ) 500 MG capsule Take 1 capsule (500 mg total) by mouth 3 (three) times daily. 04/28/19   Sofia, Leslie K, PA-C  diclofenac  Sodium (VOLTAREN ) 1 % GEL Apply 4 g topically 4 (four) times daily. 10/18/22   Emil Share, DO  methocarbamol  (ROBAXIN ) 500 MG tablet Take 1 tablet (500 mg total) by mouth 3 (three) times daily as needed for muscle spasms. 07/10/22   Harris, Abigail, PA-C  metroNIDAZOLE  (FLAGYL ) 500 MG tablet Take 1 tablet (500 mg total) by mouth 2 (two) times daily. 12/22/21   Stinson, Jacob J, DO  naproxen  (NAPROSYN ) 375 MG tablet Take 1 tablet (375 mg total) by mouth 2 (two) times daily with a meal. 07/10/22   Arloa Chroman, PA-C  traMADol  (ULTRAM ) 50 MG tablet Take 1 tablet (50 mg  total) by mouth every 6 (six) hours as needed. 07/10/22   Harris, Abigail, PA-C    Allergies: Hydrocodone    Review of Systems  Updated Vital Signs BP 110/79 (BP Location: Right Arm)   Pulse 65   Temp 97.7 F (36.5 C)   Resp 20   Ht 5' 10 (1.778 m)   Wt 67.6 kg   LMP 08/19/2023 (Approximate)   SpO2 100%   BMI 21.38 kg/m   Physical Exam Constitutional:      Comments: Alert nontoxic well in appearance.  No respiratory distress.  Mental status clear.  HENT:     Mouth/Throat:     Pharynx: Oropharynx is clear.  Eyes:     Extraocular Movements: Extraocular movements intact.  Pulmonary:     Effort: Pulmonary effort is normal.  Musculoskeletal:     Comments: Left paraspinous and midline spine nontender.  Left shoulder nontender normal range of motion.  Left upper arm nontender without soft tissue abnormality.  Left elbow nontender with normal range of motion.  Reproducible pain over the volar aspect of the wrist and forearm slightly more to the ulnar aspect.  Pain reproduced by extension of the wrist.  No swelling or redness of the hand or the wrist.  Radial pulse 2+ and strong.  Patient can perform grip strength both flexing extending fingers.  Intramuscular muscles intact.  Brisk cap refill.  Neurological:     General: No focal deficit present.     Mental Status: She is oriented to person, place, and time.     Coordination: Coordination normal.  Psychiatric:        Mood and Affect: Mood normal.     (all labs ordered are listed, but only abnormal results are displayed) Labs Reviewed - No data to display  EKG: None  Radiology: No results found.   Procedures   Medications Ordered in the ED - No data to display                                  Medical Decision Making  Patient presents as outlined.  She has isolated pain in the wrist and forearm with intermittent numbness of 3 of the digits.  Physical exam is consistent with carpal tunnel or other wrist or forearm  nerve compression.  Patient has intact strength and sensation with normal vascular exam.    We discussed conservative treatment with splinting, icing elevating and avoiding repetitive action with use of a NSAID.  Patient voices understanding.  Plan will be to schedule follow-up with orthopedics for recheck and determination if it will be responsive to conservative therapy or any surgical intervention may be indicated.     Final diagnoses:  Carpal tunnel syndrome of left wrist    ED Discharge Orders          Ordered    naproxen  (NAPROSYN ) 500 MG tablet  2 times daily        09/10/23 1014               Armenta Canning, MD 09/10/23 1025

## 2023-09-10 NOTE — ED Notes (Signed)
 Pt irritable in triage. Pt refused EKG and CBG.   Aleck FORBES Molt, RN

## 2023-09-12 DIAGNOSIS — G5602 Carpal tunnel syndrome, left upper limb: Secondary | ICD-10-CM | POA: Diagnosis not present

## 2023-09-13 DIAGNOSIS — M25532 Pain in left wrist: Secondary | ICD-10-CM | POA: Diagnosis not present

## 2024-02-09 DIAGNOSIS — M25531 Pain in right wrist: Secondary | ICD-10-CM | POA: Diagnosis not present

## 2024-02-09 DIAGNOSIS — R200 Anesthesia of skin: Secondary | ICD-10-CM | POA: Diagnosis not present

## 2024-02-09 DIAGNOSIS — M79641 Pain in right hand: Secondary | ICD-10-CM | POA: Diagnosis not present

## 2024-02-09 DIAGNOSIS — M79642 Pain in left hand: Secondary | ICD-10-CM | POA: Diagnosis not present

## 2024-03-14 LAB — LAB REPORT - SCANNED

## 2024-03-27 ENCOUNTER — Ambulatory Visit: Admitting: Family Medicine

## 2024-03-28 ENCOUNTER — Ambulatory Visit: Admitting: Family Medicine

## 2024-05-21 ENCOUNTER — Ambulatory Visit: Admitting: Family Medicine
